# Patient Record
Sex: Male | Born: 1980 | Race: White | Hispanic: No | Marital: Single | Smoking: Never smoker
Health system: Southern US, Community
[De-identification: ages and names within clinical notes are randomized; demographics above are authoritative.]

## PROBLEM LIST (undated history)

## (undated) DIAGNOSIS — K668 Other specified disorders of peritoneum: Secondary | ICD-10-CM

## (undated) DIAGNOSIS — F819 Developmental disorder of scholastic skills, unspecified: Secondary | ICD-10-CM

## (undated) DIAGNOSIS — T189XXA Foreign body of alimentary tract, part unspecified, initial encounter: Secondary | ICD-10-CM

## (undated) DIAGNOSIS — K3189 Other diseases of stomach and duodenum: Secondary | ICD-10-CM

## (undated) HISTORY — DX: Developmental disorder of scholastic skills, unspecified: F81.9

## (undated) HISTORY — DX: Other specified disorders of peritoneum: K66.8

## (undated) HISTORY — DX: Other diseases of stomach and duodenum: K31.89

## (undated) HISTORY — DX: Foreign body of alimentary tract, part unspecified, initial encounter: T18.9XXA

---

## 2018-09-14 ENCOUNTER — Other Ambulatory Visit: Payer: Self-pay

## 2018-09-14 ENCOUNTER — Encounter: Payer: Self-pay | Admitting: Emergency Medicine

## 2018-09-14 ENCOUNTER — Emergency Department: Payer: Medicare Other | Admitting: Anesthesiology

## 2018-09-14 ENCOUNTER — Emergency Department: Payer: Medicare Other

## 2018-09-14 ENCOUNTER — Inpatient Hospital Stay
Admission: EM | Admit: 2018-09-14 | Discharge: 2018-09-18 | DRG: 326 | Disposition: A | Discharge status: Home / Self Care | Payer: Medicare Other | Attending: Surgery | Admitting: Surgery

## 2018-09-14 ENCOUNTER — Encounter
Admission: EM | Disposition: A | Discharge status: Home / Self Care | Payer: Self-pay | Source: Home / Self Care | Attending: Surgery

## 2018-09-14 DIAGNOSIS — K659 Peritonitis, unspecified: Secondary | ICD-10-CM | POA: Diagnosis present

## 2018-09-14 DIAGNOSIS — R198 Other specified symptoms and signs involving the digestive system and abdomen: Secondary | ICD-10-CM

## 2018-09-14 DIAGNOSIS — S3639XA Other injury of stomach, initial encounter: Principal | ICD-10-CM | POA: Diagnosis present

## 2018-09-14 DIAGNOSIS — T182XXA Foreign body in stomach, initial encounter: Secondary | ICD-10-CM | POA: Diagnosis present

## 2018-09-14 DIAGNOSIS — T189XXA Foreign body of alimentary tract, part unspecified, initial encounter: Secondary | ICD-10-CM

## 2018-09-14 DIAGNOSIS — K3189 Other diseases of stomach and duodenum: Secondary | ICD-10-CM | POA: Diagnosis not present

## 2018-09-14 DIAGNOSIS — K668 Other specified disorders of peritoneum: Secondary | ICD-10-CM | POA: Diagnosis not present

## 2018-09-14 DIAGNOSIS — F79 Unspecified intellectual disabilities: Secondary | ICD-10-CM | POA: Diagnosis present

## 2018-09-14 HISTORY — DX: Other diseases of stomach and duodenum: K31.89

## 2018-09-14 HISTORY — PX: LAPAROTOMY: SHX154

## 2018-09-14 LAB — COMPREHENSIVE METABOLIC PANEL
ALBUMIN: 4.9 g/dL (ref 3.5–5.0)
ALT: 58 U/L — ABNORMAL HIGH (ref 0–44)
ANION GAP: 10 (ref 5–15)
AST: 34 U/L (ref 15–41)
Alkaline Phosphatase: 81 U/L (ref 38–126)
BUN: 12 mg/dL (ref 6–20)
CO2: 28 mmol/L (ref 22–32)
Calcium: 9.8 mg/dL (ref 8.9–10.3)
Chloride: 100 mmol/L (ref 98–111)
Creatinine, Ser: 0.86 mg/dL (ref 0.61–1.24)
GFR calc Af Amer: >60 mL/min (ref >60)
GFR calc non Af Amer: >60 mL/min (ref >60)
GLUCOSE: 166 mg/dL — AB (ref 70–99)
POTASSIUM: 4 mmol/L (ref 3.5–5.1)
SODIUM: 138 mmol/L (ref 135–145)
Total Bilirubin: 1 mg/dL (ref 0.3–1.2)
Total Protein: 9 g/dL — ABNORMAL HIGH (ref 6.5–8.1)

## 2018-09-14 LAB — CBC
HCT: 44 % (ref 40.0–52.0)
Hemoglobin: 15.2 g/dL (ref 13.0–18.0)
MCH: 30.5 pg (ref 26.0–34.0)
MCHC: 34.7 g/dL (ref 32.0–36.0)
MCV: 87.9 fL (ref 80.0–100.0)
PLATELETS: 247 10*3/uL (ref 150–440)
RBC: 5 MIL/uL (ref 4.40–5.90)
RDW: 13.8 % (ref 11.5–14.5)
WBC: 18.3 10*3/uL — AB (ref 3.8–10.6)

## 2018-09-14 LAB — URINALYSIS, COMPLETE (UACMP) WITH MICROSCOPIC
Bacteria, UA: NONE SEEN
Bilirubin Urine: NEGATIVE
GLUCOSE, UA: NEGATIVE mg/dL
KETONES UR: 5 mg/dL — AB
Leukocytes, UA: NEGATIVE
Nitrite: NEGATIVE
PH: 5 (ref 5.0–8.0)
PROTEIN: NEGATIVE mg/dL
Specific Gravity, Urine: 1.025 (ref 1.005–1.030)
WBC UA: NONE SEEN WBC/hpf (ref 0–5)

## 2018-09-14 LAB — PROTIME-INR
INR: 0.9
PROTHROMBIN TIME: 12.1 s (ref 11.4–15.2)

## 2018-09-14 LAB — TYPE AND SCREEN
ABO/RH(D): O POS
ANTIBODY SCREEN: NEGATIVE

## 2018-09-14 LAB — LIPASE, BLOOD: Lipase: 23 U/L (ref 11–51)

## 2018-09-14 SURGERY — LAPAROTOMY, EXPLORATORY
Anesthesia: General | Site: Abdomen

## 2018-09-14 MED ORDER — KETOROLAC TROMETHAMINE 30 MG/ML IJ SOLN
30.0000 mg | Freq: Four times a day (QID) | INTRAMUSCULAR | Status: DC
  Administered 2018-09-14 – 2018-09-18 (×15): 30 mg via INTRAVENOUS
  Filled 2018-09-14 (×15): qty 1

## 2018-09-14 MED ORDER — ONDANSETRON HCL 4 MG/2ML IJ SOLN
INTRAMUSCULAR | Status: AC
  Filled 2018-09-14: qty 2

## 2018-09-14 MED ORDER — FENTANYL CITRATE (PF) 100 MCG/2ML IJ SOLN: 25.0000 ug | INTRAMUSCULAR | Status: DC | PRN

## 2018-09-14 MED ORDER — SODIUM CHLORIDE 0.9 % IV BOLUS
1000.0000 mL | Freq: Once | INTRAVENOUS | Status: AC
  Administered 2018-09-14: 1000 mL via INTRAVENOUS

## 2018-09-14 MED ORDER — KETOROLAC TROMETHAMINE 30 MG/ML IJ SOLN
INTRAMUSCULAR | Status: DC | PRN
  Administered 2018-09-14: 30 mg via INTRAVENOUS

## 2018-09-14 MED ORDER — FENTANYL CITRATE (PF) 100 MCG/2ML IJ SOLN
INTRAMUSCULAR | Status: DC | PRN
  Administered 2018-09-14 (×2): 50 ug via INTRAVENOUS

## 2018-09-14 MED ORDER — ENOXAPARIN SODIUM 40 MG/0.4ML ~~LOC~~ SOLN
40.0000 mg | SUBCUTANEOUS | Status: DC
  Administered 2018-09-15 – 2018-09-18 (×4): 40 mg via SUBCUTANEOUS
  Filled 2018-09-14 (×4): qty 0.4

## 2018-09-14 MED ORDER — HYDROMORPHONE HCL 1 MG/ML IJ SOLN
1.0000 mg | Freq: Once | INTRAMUSCULAR | Status: AC
  Administered 2018-09-14: 1 mg via INTRAVENOUS
  Filled 2018-09-14: qty 1

## 2018-09-14 MED ORDER — LIDOCAINE HCL (PF) 2 % IJ SOLN
INTRAMUSCULAR | Status: AC
  Filled 2018-09-14: qty 10

## 2018-09-14 MED ORDER — LIDOCAINE HCL (CARDIAC) PF 100 MG/5ML IV SOSY
PREFILLED_SYRINGE | INTRAVENOUS | Status: DC | PRN
  Administered 2018-09-14: 100 mg via INTRAVENOUS

## 2018-09-14 MED ORDER — MORPHINE SULFATE (PF) 2 MG/ML IV SOLN
2.0000 mg | INTRAVENOUS | Status: DC | PRN
  Administered 2018-09-14: 2 mg via INTRAVENOUS
  Filled 2018-09-14: qty 1

## 2018-09-14 MED ORDER — LACTATED RINGERS IV SOLN
INTRAVENOUS | Status: DC | PRN
  Administered 2018-09-14 (×2): via INTRAVENOUS

## 2018-09-14 MED ORDER — OXYCODONE HCL 5 MG/5ML PO SOLN: 5.0000 mg | Freq: Once | ORAL | Status: DC | PRN

## 2018-09-14 MED ORDER — BUPIVACAINE LIPOSOME 1.3 % IJ SUSP
INTRAMUSCULAR | Status: AC
  Filled 2018-09-14: qty 20

## 2018-09-14 MED ORDER — MIDAZOLAM HCL 2 MG/2ML IJ SOLN
INTRAMUSCULAR | Status: AC
  Filled 2018-09-14: qty 2

## 2018-09-14 MED ORDER — SODIUM CHLORIDE 0.9 % IV SOLN
2.0000 g | INTRAVENOUS | Status: DC
  Administered 2018-09-14 – 2018-09-17 (×4): 2 g via INTRAVENOUS
  Filled 2018-09-14 (×2): qty 2
  Filled 2018-09-14: qty 20
  Filled 2018-09-14 (×2): qty 2

## 2018-09-14 MED ORDER — MIDAZOLAM HCL 2 MG/2ML IJ SOLN
INTRAMUSCULAR | Status: DC | PRN
  Administered 2018-09-14: 2 mg via INTRAVENOUS

## 2018-09-14 MED ORDER — BUPIVACAINE HCL (PF) 0.5 % IJ SOLN
INTRAMUSCULAR | Status: DC | PRN
  Administered 2018-09-14: 30 mL

## 2018-09-14 MED ORDER — SUGAMMADEX SODIUM 200 MG/2ML IV SOLN
INTRAVENOUS | Status: DC | PRN
  Administered 2018-09-14: 200 mg via INTRAVENOUS

## 2018-09-14 MED ORDER — PIPERACILLIN-TAZOBACTAM 3.375 G IVPB 30 MIN
3.3750 g | Freq: Once | INTRAVENOUS | Status: AC
  Administered 2018-09-14: 3.375 g via INTRAVENOUS
  Filled 2018-09-14: qty 50

## 2018-09-14 MED ORDER — ACETAMINOPHEN 10 MG/ML IV SOLN
INTRAVENOUS | Status: DC | PRN
  Administered 2018-09-14: 1000 mg via INTRAVENOUS

## 2018-09-14 MED ORDER — ONDANSETRON HCL 4 MG/2ML IJ SOLN
4.0000 mg | Freq: Four times a day (QID) | INTRAMUSCULAR | Status: DC | PRN
  Administered 2018-09-14: 4 mg via INTRAVENOUS
  Filled 2018-09-14: qty 2

## 2018-09-14 MED ORDER — IOPAMIDOL (ISOVUE-300) INJECTION 61%
100.0000 mL | Freq: Once | INTRAVENOUS | Status: AC | PRN
  Administered 2018-09-14: 100 mL via INTRAVENOUS
  Filled 2018-09-14: qty 100

## 2018-09-14 MED ORDER — SEVOFLURANE IN SOLN
RESPIRATORY_TRACT | Status: AC
  Filled 2018-09-14: qty 250

## 2018-09-14 MED ORDER — BUPIVACAINE HCL (PF) 0.5 % IJ SOLN
INTRAMUSCULAR | Status: AC
  Filled 2018-09-14: qty 30

## 2018-09-14 MED ORDER — ONDANSETRON 4 MG PO TBDP: 4.0000 mg | ORAL_TABLET | Freq: Four times a day (QID) | ORAL | Status: DC | PRN

## 2018-09-14 MED ORDER — ROCURONIUM BROMIDE 100 MG/10ML IV SOLN
INTRAVENOUS | Status: DC | PRN
  Administered 2018-09-14: 50 mg via INTRAVENOUS

## 2018-09-14 MED ORDER — KETOROLAC TROMETHAMINE 30 MG/ML IJ SOLN
INTRAMUSCULAR | Status: AC
  Filled 2018-09-14: qty 1

## 2018-09-14 MED ORDER — BUPIVACAINE LIPOSOME 1.3 % IJ SUSP
INTRAMUSCULAR | Status: DC | PRN
  Administered 2018-09-14: 20 mL

## 2018-09-14 MED ORDER — ONDANSETRON HCL 4 MG/2ML IJ SOLN
INTRAMUSCULAR | Status: DC | PRN
  Administered 2018-09-14: 4 mg via INTRAVENOUS

## 2018-09-14 MED ORDER — DEXAMETHASONE SODIUM PHOSPHATE 10 MG/ML IJ SOLN
INTRAMUSCULAR | Status: DC | PRN
  Administered 2018-09-14: 10 mg via INTRAVENOUS

## 2018-09-14 MED ORDER — SUGAMMADEX SODIUM 200 MG/2ML IV SOLN
INTRAVENOUS | Status: AC
  Filled 2018-09-14: qty 2

## 2018-09-14 MED ORDER — OXYCODONE HCL 5 MG PO TABS: 5.0000 mg | ORAL_TABLET | Freq: Once | ORAL | Status: DC | PRN

## 2018-09-14 MED ORDER — PROPOFOL 10 MG/ML IV BOLUS
INTRAVENOUS | Status: DC | PRN
  Administered 2018-09-14: 200 mg via INTRAVENOUS

## 2018-09-14 MED ORDER — ACETAMINOPHEN 10 MG/ML IV SOLN
INTRAVENOUS | Status: AC
  Filled 2018-09-14: qty 100

## 2018-09-14 MED ORDER — PROPOFOL 10 MG/ML IV BOLUS
INTRAVENOUS | Status: AC
  Filled 2018-09-14: qty 20

## 2018-09-14 MED ORDER — DEXTROSE IN LACTATED RINGERS 5 % IV SOLN
INTRAVENOUS | Status: DC
  Administered 2018-09-14 – 2018-09-16 (×4): via INTRAVENOUS

## 2018-09-14 MED ORDER — ONDANSETRON HCL 4 MG/2ML IJ SOLN
4.0000 mg | Freq: Once | INTRAMUSCULAR | Status: AC
  Administered 2018-09-14: 4 mg via INTRAVENOUS
  Filled 2018-09-14: qty 2

## 2018-09-14 MED ORDER — FENTANYL CITRATE (PF) 100 MCG/2ML IJ SOLN
INTRAMUSCULAR | Status: AC
  Filled 2018-09-14: qty 2

## 2018-09-14 MED ORDER — DEXAMETHASONE SODIUM PHOSPHATE 10 MG/ML IJ SOLN
INTRAMUSCULAR | Status: AC
  Filled 2018-09-14: qty 1

## 2018-09-14 MED ORDER — PHENYLEPHRINE HCL 10 MG/ML IJ SOLN
INTRAMUSCULAR | Status: DC | PRN
  Administered 2018-09-14 (×2): 100 ug via INTRAVENOUS

## 2018-09-14 MED ORDER — FAMOTIDINE IN NACL 20-0.9 MG/50ML-% IV SOLN
20.0000 mg | Freq: Two times a day (BID) | INTRAVENOUS | Status: DC
  Administered 2018-09-14 – 2018-09-18 (×8): 20 mg via INTRAVENOUS
  Filled 2018-09-14 (×8): qty 50

## 2018-09-14 MED ORDER — SUCCINYLCHOLINE CHLORIDE 20 MG/ML IJ SOLN
INTRAMUSCULAR | Status: DC | PRN
  Administered 2018-09-14: 100 mg via INTRAVENOUS

## 2018-09-14 SURGICAL SUPPLY — 46 items
APPLIER CLIP 11 MED OPEN (CLIP)
APPLIER CLIP 13 LRG OPEN (CLIP)
BAG BILE T-TUBES STRL (MISCELLANEOUS) IMPLANT
BARRIER SKIN 2 3/4 (OSTOMY) IMPLANT
BARRIER SKIN 2 3/4 INCH (OSTOMY)
CHLORAPREP W/TINT 26ML (MISCELLANEOUS) ×3 IMPLANT
CLAMP POUCH DRAINAGE QUIET (OSTOMY) IMPLANT
CLIP APPLIE 11 MED OPEN (CLIP) IMPLANT
CLIP APPLIE 13 LRG OPEN (CLIP) IMPLANT
DRAPE LAPAROTOMY 100X77 ABD (DRAPES) ×3 IMPLANT
DRSG OPSITE POSTOP 4X10 (GAUZE/BANDAGES/DRESSINGS) ×3 IMPLANT
ELECT BLADE 6 FLAT ULTRCLN (ELECTRODE) ×3 IMPLANT
ELECT REM PT RETURN 9FT ADLT (ELECTROSURGICAL) ×3
ELECTRODE REM PT RTRN 9FT ADLT (ELECTROSURGICAL) ×1 IMPLANT
GAUZE SPONGE 4X4 12PLY STRL (GAUZE/BANDAGES/DRESSINGS) ×3 IMPLANT
GLOVE BIOGEL PI IND STRL 7.5 (GLOVE) ×1 IMPLANT
GLOVE BIOGEL PI INDICATOR 7.5 (GLOVE) ×2
GLOVE ECLIPSE 7.0 STRL STRAW (GLOVE) ×3 IMPLANT
GOWN STRL REUS W/TWL LRG LVL3 (GOWN DISPOSABLE) ×9 IMPLANT
HANDLE SUCTION POOLE (INSTRUMENTS) ×1 IMPLANT
KIT TURNOVER KIT A (KITS) ×3 IMPLANT
NEEDLE HYPO 22GX1.5 SAFETY (NEEDLE) ×3 IMPLANT
NS IRRIG 1000ML POUR BTL (IV SOLUTION) ×3 IMPLANT
PACK BASIN MAJOR ARMC (MISCELLANEOUS) ×3 IMPLANT
PACK COLON CLEAN CLOSURE (MISCELLANEOUS) ×3 IMPLANT
RELOAD LINEAR CUT PROX 55 BLUE (ENDOMECHANICALS) IMPLANT
RELOAD PROXIMATE 75MM BLUE (ENDOMECHANICALS) IMPLANT
RETRACTOR WND ALEXIS-O 25 LRG (MISCELLANEOUS) IMPLANT
RETRACTOR WOUND ALXS 18CM MED (MISCELLANEOUS) IMPLANT
RTRCTR WOUND ALEXIS O 18CM MED (MISCELLANEOUS)
RTRCTR WOUND ALEXIS O 25CM LRG (MISCELLANEOUS)
STAPLER GUN LINEAR PROX 60 (STAPLE) IMPLANT
STAPLER PROXIMATE 55 BLUE (STAPLE) IMPLANT
STAPLER PROXIMATE 75MM BLUE (STAPLE) IMPLANT
SUCTION POOLE HANDLE (INSTRUMENTS) ×3
SUT CHROMIC 0 SH (SUTURE) IMPLANT
SUT CHROMIC 2 0 SH (SUTURE) IMPLANT
SUT PDS #1 CTX NDL (SUTURE) ×3 IMPLANT
SUT PDS AB 0 CT1 27 (SUTURE) ×6 IMPLANT
SUT SILK 2 0 (SUTURE) ×2
SUT SILK 2-0 18XBRD TIE 12 (SUTURE) ×1 IMPLANT
SUT SILK 3-0 (SUTURE) ×2
SUT SILK 3-0 SH-1 18XCR BRD (SUTURE) ×1
SUTURE SILK 3-0 SH-1 18XCR BRD (SUTURE) ×1 IMPLANT
SYR 20CC LL (SYRINGE) ×3 IMPLANT
TRAY FOLEY SLVR 16FR LF STAT (SET/KITS/TRAYS/PACK) ×3 IMPLANT

## 2018-09-14 NOTE — ED Notes (Signed)
Pt states last meal was today at 10:00am. Pt enies N/V/D. NAD noted. Family at bedside

## 2018-09-14 NOTE — Anesthesia Procedure Notes (Addendum)
Procedure Name: Intubation Date/Time: 09/14/2018 4:33 PM Performed by: Doreen Salvage, CRNA Pre-anesthesia Checklist: Patient identified, Emergency Drugs available, Suction available and Patient being monitored Patient Re-evaluated:Patient Re-evaluated prior to induction Oxygen Delivery Method: Circle system utilized Preoxygenation: Pre-oxygenation with 100% oxygen Induction Type: IV induction, Cricoid Pressure applied and Rapid sequence Ventilation: Mask ventilation without difficulty Laryngoscope Size: Mac and 4 Grade View: Grade II Tube type: Oral Tube size: 7.5 mm Number of attempts: 1 Airway Equipment and Method: Stylet Placement Confirmation: ETT inserted through vocal cords under direct vision,  positive ETCO2 and breath sounds checked- equal and bilateral Secured at: 23 cm Tube secured with: Tape Dental Injury: Teeth and Oropharynx as per pre-operative assessment

## 2018-09-14 NOTE — ED Provider Notes (Addendum)
[facility] Emergency Department Provider Note  ____________________________________________   I have reviewed the triage vital signs and the nursing notes. Where available I have reviewed prior notes and, if possible and indicated, outside hospital notes.    HISTORY  Chief Complaint Abdominal Pain    HPI Ronald Hardy is a 37 y.o. male who does not have a history of abdominal surgery states he rolled over last night or this morning early and had a sharp pain to his abdomen which is been getting progressively worse.  No fever no vomiting no melena no bright red blood per rectum normal bowel meds, its worse when he changes position, has not tried to eat, no other alleviating or aggravating factors, no other complaint.  Denies fever or chills.  It is sharp and significant.  No radiation.    History reviewed. No pertinent past medical history.  There are no active problems to display for this patient.   History reviewed. No pertinent surgical history.  Prior to Admission medications   Not on File    Allergies Patient has no known allergies.  No family history on file.  Social History Social History   Tobacco Use  . Smoking status: Never Smoker  . Smokeless tobacco: Never Used  Substance Use Topics  . Alcohol use: Not Currently  . Drug use: Not on file    Review of Systems Constitutional: No fever/chills Eyes: No visual changes. ENT: No sore throat. No stiff neck no neck pain Cardiovascular: Denies chest pain. Respiratory: Denies shortness of breath. Gastrointestinal:   no vomiting.  No diarrhea.  No constipation. Genitourinary: Negative for dysuria. Musculoskeletal: Negative lower extremity swelling Skin: Negative for rash. Neurological: Negative for severe headaches, focal weakness or numbness.   ____________________________________________   PHYSICAL EXAM:  VITAL SIGNS: ED Triage Vitals [09/14/18 1232]  Enc Vitals Group     BP  132/82     Pulse Rate (!) 103     Resp 18     Temp 98.3 F (36.8 C)     Temp Source Oral     SpO2 97 %     Weight 175 lb (79.4 kg)     Height 5\' 5"  (1.651 m)     Head Circumference      Peak Flow      Pain Score 10     Pain Loc      Pain Edu?      Excl. in GC?     Constitutional: Alert and oriented.  Peers to be uncomfortable but nontoxic Eyes: Conjunctivae are normal Head: Atraumatic HEENT: No congestion/rhinnorhea. Mucous membranes are moist.  Oropharynx non-erythematous Neck:   Nontender with no meningismus, no masses, no stridor Cardiovascular: Normal rate, regular rhythm. Grossly normal heart sounds.  Good peripheral circulation. Respiratory: Normal respiratory effort.  No retractions. Lungs CTAB. Abdominal: Abdomen is rigid with voluntary and involuntary guarding.  Difficult to appreciate rebound.  Diminished bowel sounds.  Positive peritoneal signs. Back:  There is no focal tenderness or step off.  there is no midline tenderness there are no lesions noted. there is no CVA tenderness Musculoskeletal: No lower extremity tenderness, no upper extremity tenderness. No joint effusions, no DVT signs strong distal pulses no edema Neurologic:  Normal speech and language. No gross focal neurologic deficits are appreciated.  Skin:  Skin is warm, dry and intact. No rash noted. Psychiatric: Mood and affect are normal. Speech and behavior are normal.  ____________________________________________   LABS (all labs ordered are listed,  but only abnormal results are displayed)  Labs Reviewed  URINALYSIS, COMPLETE (UACMP) WITH MICROSCOPIC - Abnormal; Notable for the following components:      Result Value   Color, Urine YELLOW (*)    APPearance HAZY (*)    Hgb urine dipstick SMALL (*)    Ketones, ur 5 (*)    All other components within normal limits  COMPREHENSIVE METABOLIC PANEL - Abnormal; Notable for the following components:   Glucose, Bld 166 (*)    Total Protein 9.0 (*)    ALT  58 (*)    All other components within normal limits  CBC - Abnormal; Notable for the following components:   WBC 18.3 (*)    All other components within normal limits  LIPASE, BLOOD    Pertinent labs  results that were available during my care of the patient were reviewed by me and considered in my medical decision making (see chart for details). ____________________________________________  EKG  I personally interpreted any EKGs ordered by me or triage  ____________________________________________  RADIOLOGY  Pertinent labs & imaging results that were available during my care of the patient were reviewed by me and considered in my medical decision making (see chart for details). If possible, patient and/or family made aware of any abnormal findings.  No results found. ____________________________________________    PROCEDURES  Procedure(s) performed: None  Procedures  Critical Care performed: CRITICAL CARE Performed by: Jeanmarie Plant   Total critical care time: 45 minutes  Critical care time was exclusive of separately billable procedures and treating other patients.  Critical care was necessary to treat or prevent imminent or life-threatening deterioration.  Critical care was time spent personally by me on the following activities: development of treatment plan with patient and/or surrogate as well as nursing, discussions with consultants, evaluation of patient's response to treatment, examination of patient, obtaining history from patient or surrogate, ordering and performing treatments and interventions, ordering and review of laboratory studies, ordering and review of radiographic studies, pulse oximetry and re-evaluation of patient's condition.   ____________________________________________   INITIAL IMPRESSION / ASSESSMENT AND PLAN / ED COURSE  Pertinent labs & imaging results that were available during my care of the patient were reviewed by me and  considered in my medical decision making (see chart for details).   She with peritoneal signs, I immediately started him on antibiotics, pain medication IV fluid, we are keeping him n.p.o., unclear initially what the etiology was.  I sent him therefore for CT and accompanied him to CT, I did make surgery aware after I did my initial exam about my concerns for this man's belly is that I think is a surgical belly.  CT scan shows a foreign body in the stomach with evidence of free air.  Patient has no recollection of ever swallowing anything like that.  It is about the consistency CT scan of bone or possibly dense plastic according to radiologist with whom I have also personally discussed the case.  I also looked at the film myself.  We are instituting preop work-up on the patient, again, see below, surgery attending is in the operating room we are making him aware of our findings and as noted patient is already had antibiotics  ----------------------------------------- 2:52 PM on 09/14/2018 -----------------------------------------  Called dr. Earlene Plater of surgery, he is operating. Will keep him posted.    ----------------------------------------- 3:20 PM on 09/14/2018 ----------------------------------------- Paging surgery again  ----------------------------------------- 3:27 PM on 09/14/2018 -----------------------------------------  Inform Dr. Earlene Plater of surgery  about patient, initiating preop work-up, antibiotics already administered, patient much more comfortable after pain medication, informed Dr. Earlene Plater and patient no findings on CT scan.  Administering IV fluid, awaiting surgical disposition.  Patient has no recollection of swallowing a foreign body.     ____________________________________________   FINAL CLINICAL IMPRESSION(S) / ED DIAGNOSES  Final diagnoses:  None      This chart was dictated using voice recognition software.  Despite best efforts to proofread,  errors can  occur which can change meaning.      Jeanmarie Plant, MD 09/14/18 1523    Jeanmarie Plant, MD 09/14/18 1528    Jeanmarie Plant, MD 09/14/18 [phone]

## 2018-09-14 NOTE — H&P (Addendum)
SURGICAL ADMISSION H&P  Patient seen and examined as described below with surgical PA-C, Gillermina Phy.  Assessment/Plan: (ICD-10's: K66.8,T18.9) In summary, patient is a 37 y.o. male with gastro-pyloric perforation secondary to ingested shark tooth 3 days ago, complicated by pertinent comorbidities including developmental delay.             - NPO, IV fluids             - IV antibiotics (Zosyn already administered)            - all risks, benefits, and alternatives to exploratory laparotomy with extraction of foreign body and repair of visceral perforation were discussed with the patient and his mother, all of their questions were answered to their expressed satisfaction, patient and his mom express they wish to proceed, and informed consent was obtained.            - will proceed immediately to OR for above procedure  I have personally reviewed the patient's chart, evaluated/examined the patient, proposed the recommended management, and discussed these recommendations with the patient and his family to their expressed satisfaction as well as with patient's ED physician.  Thank you for the opportunity to participate in this patient's care.  -- Scherrie Gerlach Earlene Plater, MD, RPVI [place]: [facility] General Surgery - Partnering for exceptional care. Office: [phone]    SURGICAL H&P NOTE  HISTORY OF PRESENT ILLNESS (HPI):  37 y.o. male presented to [facility] ED today for evaluation of abdominal pain. Patient reports the acute onset of worsening epigastric abdominal pain this morning. He went to [facility] this morning and diagnosed with gastritis. However, as the day went on he continued to notice worsening pain. The pain is sharp in nature and 10/10. After investigation, the patient endorsed that he put a shark tooth necklace in his mouth and swallowed it about a week ago. He did not notice any pain until this morning. He has been eating without difficulty  until today. Has had non-bloody bowel movements.  Surgery is consulted by emergency physician physician Dr. Alphonzo Lemmings, MD in this context for evaluation and management of abdominal pain, pneumoperitoneum. Marland Kitchen  PAST MEDICAL HISTORY (PMH):  History reviewed. No pertinent past medical history.   PAST SURGICAL HISTORY (PSH):  History reviewed. No pertinent surgical history.   MEDICATIONS:  Prior to Admission medications   Not on File     ALLERGIES:  No Known Allergies   SOCIAL HISTORY:  Social History   Socioeconomic History  . Marital status: Single    Spouse name: Not on file  . Number of children: Not on file  . Years of education: Not on file  . Highest education level: Not on file  Occupational History  . Not on file  Social Needs  . Financial resource strain: Not on file  . Food insecurity:    Worry: Not on file    Inability: Not on file  . Transportation needs:    Medical: Not on file    Non-medical: Not on file  Tobacco Use  . Smoking status: Never Smoker  . Smokeless tobacco: Never Used  Substance and Sexual Activity  . Alcohol use: Not Currently  . Drug use: Not on file  . Sexual activity: Not on file  Lifestyle  . Physical activity:    Days per week: Not on file    Minutes per session: Not on file  . Stress: Not on file  Relationships  . Social connections:    Talks on phone:  Not on file    Gets together: Not on file    Attends religious service: Not on file    Active member of club or organization: Not on file    Attends meetings of clubs or organizations: Not on file    Relationship status: Not on file  . Intimate partner violence:    Fear of current or ex partner: Not on file    Emotionally abused: Not on file    Physically abused: Not on file    Forced sexual activity: Not on file  Other Topics Concern  . Not on file  Social History Narrative  . Not on file    The patient currently resides (home / rehab facility / nursing home): Home The  patient normally is (ambulatory / bedbound): Ambulatory   FAMILY HISTORY:  No family history on file.   REVIEW OF SYSTEMS:  Constitutional: denies weight loss, fever, chills, or sweats  Eyes: denies any other vision changes, history of eye injury  ENT: denies sore throat, hearing problems  Respiratory: denies shortness of breath, wheezing  Cardiovascular: denies chest pain, palpitations  Gastrointestinal: + abdominal pain, denied N/V, or diarrhea/and bowel function as per HPI Genitourinary: denies burning with urination or urinary frequency Musculoskeletal: denies any other joint pains or cramps  Skin: denies any other rashes or skin discolorations  Neurological: denies any other headache, dizziness, weakness  Psychiatric: denies any other depression, anxiety   All other review of systems were negative   VITAL SIGNS:  Temp:  [98.3 F (36.8 C)-100 F (37.8 C)] 100 F (37.8 C) (10/02 1445) Pulse Rate:  [101-112] 112 (10/02 1614) Resp:  [18-22] 22 (10/02 1614) BP: (116-137)/(79-93) 137/93 (10/02 1614) SpO2:  [94 %-98 %] 94 % (10/02 1614) Weight:  [79.4 kg] 79.4 kg (10/02 1257)     Height: 5\' 4"  (162.6 cm) Weight: 79.4 kg BMI (Calculated): 30.02   INTAKE/OUTPUT:  This shift: No intake/output data recorded.  Last 2 shifts: @IOLAST2SHIFTS @   PHYSICAL EXAM:  Constitutional:  -- Normal body habitus  -- Awake, alert, and oriented x3, no apparent distress Eyes:  -- Pupils equally round and reactive to light  -- No scleral icterus, B/L no occular discharge Ear, nose, throat: -- Neck is FROM WNL Pulmonary:  -- No wheezes or rhales -- Equal breath sounds bilaterally -- Breathing non-labored at rest Cardiovascular:  -- S1, S2 present -- Tachycardic -- No pericardial rubs  Gastrointestinal:  -- Abdomen is tense, tender epigastric, non-distended, + guarding, unable to appreciate rebound, grossly peritonitic  -- No abdominal masses appreciated, pulsatile or otherwise   Musculoskeletal and Integumentary:  -- Wounds or skin discoloration: None appreciated -- Extremities: B/L UE and LE FROM, hands and feet warm, no edema  Neurologic:  -- Motor function: Intact and symmetric -- Sensation: Intact and symmetric Psychiatric:  -- Mood and affect WNL    Labs:  CBC Latest Ref Rng & Units 09/14/2018  WBC 3.8 - 10.6 K/uL 18.3(H)  Hemoglobin 13.0 - 18.0 g/dL 16.1  Hematocrit 09.6 - 52.0 % 44.0  Platelets 150 - 440 K/uL 247   CMP Latest Ref Rng & Units 09/14/2018  Glucose 70 - 99 mg/dL 045(W)  BUN 6 - 20 mg/dL 12  Creatinine 0.98 - 1.19 mg/dL 1.47  Sodium 829 - 562 mmol/L 138  Potassium 3.5 - 5.1 mmol/L 4.0  Chloride 98 - 111 mmol/L 100  CO2 22 - 32 mmol/L 28  Calcium 8.9 - 10.3 mg/dL 9.8  Total Protein 6.5 -  8.1 g/dL 9.0(H)  Total Bilirubin 0.3 - 1.2 mg/dL 1.0  Alkaline Phos 38 - 126 U/L 81  AST 15 - 41 U/L 34  ALT 0 - 44 U/L 58(H)    Imaging studies:   CT Abdomen/Pelvis on 10/02:   IMPRESSION: 1. Small volume pneumoperitoneum due to gastric antral perforation by 4.1 x 1.3 cm radiopaque foreign body (likely glass). Small volume ascites. 2. Borderline cardiomegaly, pulmonary vascular congestion or atelectasis. Recommend chest radiograph. 3. Patulous esophagus, potential achalasia. 4. Acute findings discussed with and reconfirmed by Dr.JAMES MCSHANE on 09/14/2018 at 3:20 pm.  Assessment/Plan: (ICD-10's: K66.8, T18.9) 37 y.o. male with swallowed foreign body and pneumoperitoneum, complicated by pertinent comorbidities including developmental delay.   - Patient with retained foreign body on CT with pneumoperitoneum and peritonitis on examination. Discussed need for emergent laparotomy with the patient and his mother who are in agreement.   - Will admit to general surgery for post-operative management   A/all risks, benefits, and alternatives to above emergent procedure(s) were discussed with the patient and his family, all of their questions  were answered to their expressed satisfaction, patient expresses he wishes to proceed, and informed consent was obtained.  All of the above findings and recommendations were discussed with the patient and his family, and all of patient's and his family's questions were answered to their expressed satisfaction.  Thank you for the opportunity to participate in this patient's care.   -- Lynden Oxford, PA-C [facility] 09/14/2018, 4:24 PM [phone] M-F: 7am - 4pm

## 2018-09-14 NOTE — ED Notes (Signed)
Report called to tanaleah rn or nurse.

## 2018-09-14 NOTE — Anesthesia Post-op Follow-up Note (Signed)
Anesthesia QCDR form completed.        

## 2018-09-14 NOTE — ED Notes (Signed)
Pt to Ct at this time.

## 2018-09-14 NOTE — Consult Note (Addendum)
SURGICAL CONSULTATION NOTE (initial) - cpt: 16109  Patient seen and examined as described below with surgical PA-C, Gillermina Phy.  Assessment/Plan: (ICD-10's: K66.8, T18.9) In summary, patient is a 37 y.o. male with gastro-pyloric perforation secondary to ingested shark tooth 3 days ago, complicated by pertinent comorbidities including developmental delay.   - NPO, IV fluids   - IV antibiotics (Zosyn already administered)  - all risks, benefits, and alternatives to exploratory laparotomy with extraction of foreign body and repair of visceral perforation were discussed with the patient and his mother, all of their questions were answered to their expressed satisfaction, patient and his mom express they wish to proceed, and informed consent was obtained.  - will proceed immediately to OR for above procedure  I have personally reviewed the patient's chart, evaluated/examined the patient, proposed the recommended management, and discussed these recommendations with the patient and his family to their expressed satisfaction as well as with patient's ED physician.  Thank you for the opportunity to participate in this patient's care.  -- Scherrie Gerlach Earlene Plater, MD, RPVI [place]: [facility] General Surgery - Partnering for exceptional care. Office: [phone]  SURGICAL CONSULTATION NOTE (initial) - cpt: 99254   HISTORY OF PRESENT ILLNESS (HPI):  37 y.o. male presented to [facility] ED today for evaluation of abdominal pain. Patient reports the acute onset of worsening epigastric abdominal pain this morning. He went to [facility] this morning and diagnosed with gastritis. However, as the day went on he continued to notice worsening pain. The pain is sharp in nature and 10/10. After investigation, the patient endorsed that he put a shark tooth necklace in his mouth and swallowed it about a week ago. He did not notice any pain until this morning. He has been eating without difficulty until today.  Has had non-bloody bowel movements.  Surgery is consulted by emergency physician physician Dr. Alphonzo Lemmings, MD in this context for evaluation and management of abdominal pain, pneumoperitoneum. Marland Kitchen  PAST MEDICAL HISTORY (PMH):  History reviewed. No pertinent past medical history.   PAST SURGICAL HISTORY (PSH):  History reviewed. No pertinent surgical history.   MEDICATIONS:  Prior to Admission medications   Not on File     ALLERGIES:  No Known Allergies   SOCIAL HISTORY:  Social History        Socioeconomic History  . Marital status: Single    Spouse name: Not on file  . Number of children: Not on file  . Years of education: Not on file  . Highest education level: Not on file  Occupational History  . Not on file  Social Needs  . Financial resource strain: Not on file  . Food insecurity:    Worry: Not on file    Inability: Not on file  . Transportation needs:    Medical: Not on file    Non-medical: Not on file  Tobacco Use  . Smoking status: Never Smoker  . Smokeless tobacco: Never Used  Substance and Sexual Activity  . Alcohol use: Not Currently  . Drug use: Not on file  . Sexual activity: Not on file  Lifestyle  . Physical activity:    Days per week: Not on file    Minutes per session: Not on file  . Stress: Not on file  Relationships  . Social connections:    Talks on phone: Not on file    Gets together: Not on file    Attends religious service: Not on file    Active member of club  or organization: Not on file    Attends meetings of clubs or organizations: Not on file    Relationship status: Not on file  . Intimate partner violence:    Fear of current or ex partner: Not on file    Emotionally abused: Not on file    Physically abused: Not on file    Forced sexual activity: Not on file  Other Topics Concern  . Not on file  Social History Narrative  . Not on file    The patient currently resides (home / rehab  facility / nursing home): Home The patient normally is (ambulatory / bedbound): Ambulatory   FAMILY HISTORY:  No family history on file.   REVIEW OF SYSTEMS:  Constitutional: denies weight loss, fever, chills, or sweats  Eyes: denies any other vision changes, history of eye injury  ENT: denies sore throat, hearing problems  Respiratory: denies shortness of breath, wheezing  Cardiovascular: denies chest pain, palpitations  Gastrointestinal: + abdominal pain, denied N/V, or diarrhea/and bowel function as per HPI Genitourinary: denies burning with urination or urinary frequency Musculoskeletal: denies any other joint pains or cramps  Skin: denies any other rashes or skin discolorations  Neurological: denies any other headache, dizziness, weakness  Psychiatric: denies any other depression, anxiety   All other review of systems were negative   VITAL SIGNS:  Temp:  [98.3 F (36.8 C)-100 F (37.8 C)] 100 F (37.8 C) (10/02 1445) Pulse Rate:  [101-112] 112 (10/02 1614) Resp:  [18-22] 22 (10/02 1614) BP: (116-137)/(79-93) 137/93 (10/02 1614) SpO2:  [94 %-98 %] 94 % (10/02 1614) Weight:  [79.4 kg] 79.4 kg (10/02 1257)     Height: 5\' 4"  (162.6 cm) Weight: 79.4 kg BMI (Calculated): 30.02   INTAKE/OUTPUT:  This shift: No intake/output data recorded.  Last 2 shifts: @IOLAST2SHIFTS @   PHYSICAL EXAM:  Constitutional:  -- Normal body habitus  -- Awake, alert, and oriented x3, no apparent distress Eyes:  -- Pupils equally round and reactive to light  -- No scleral icterus, B/L no occular discharge Ear, nose, throat: -- Neck is FROM WNL Pulmonary:  -- No wheezes or rhales -- Equal breath sounds bilaterally -- Breathing non-labored at rest Cardiovascular:  -- S1, S2 present -- Tachycardic -- No pericardial rubs  Gastrointestinal:  -- Abdomen is tense, tender epigastric, non-distended, + guarding, unable to appreciate rebound, grossly peritonitic  -- No abdominal masses  appreciated, pulsatile or otherwise  Musculoskeletal and Integumentary:  -- Wounds or skin discoloration: None appreciated -- Extremities: B/L UE and LE FROM, hands and feet warm, no edema  Neurologic:  -- Motor function: Intact and symmetric -- Sensation: Intact and symmetric Psychiatric:  -- Mood and affect WNL    Labs:  CBC Latest Ref Rng & Units 09/14/2018  WBC 3.8 - 10.6 K/uL 18.3(H)  Hemoglobin 13.0 - 18.0 g/dL 16.1  Hematocrit 09.6 - 52.0 % 44.0  Platelets 150 - 440 K/uL 247   CMP Latest Ref Rng & Units 09/14/2018  Glucose 70 - 99 mg/dL 045(W)  BUN 6 - 20 mg/dL 12  Creatinine 0.98 - 1.19 mg/dL 1.47  Sodium 829 - 562 mmol/L 138  Potassium 3.5 - 5.1 mmol/L 4.0  Chloride 98 - 111 mmol/L 100  CO2 22 - 32 mmol/L 28  Calcium 8.9 - 10.3 mg/dL 9.8  Total Protein 6.5 - 8.1 g/dL 9.0(H)  Total Bilirubin 0.3 - 1.2 mg/dL 1.0  Alkaline Phos 38 - 126 U/L 81  AST 15 - 41 U/L 34  ALT 0 - 44 U/L 58(H)    Imaging studies:   CT Abdomen/Pelvis on 10/02:   IMPRESSION: 1. Small volume pneumoperitoneum due to gastric antral perforation by 4.1 x 1.3 cm radiopaque foreign body (likely glass). Small volume ascites. 2. Borderline cardiomegaly, pulmonary vascular congestion or atelectasis. Recommend chest radiograph. 3. Patulous esophagus, potential achalasia. 4. Acute findings discussed with and reconfirmed by Dr.JAMES MCSHANE on 09/14/2018 at 3:20 pm.  Assessment/Plan: (ICD-10's: K66.8, T18.9) 37 y.o. male with swallowed foreign body and pneumoperitoneum, complicated by pertinent comorbidities including developmental delay.              - Patient with retained foreign body on CT with pneumoperitoneum and peritonitis on examination. Discussed need for emergent laparotomy with the patient and his mother who are in agreement.              - Will admit to general surgery for post-operative management              A/all risks, benefits, and alternatives to above emergent  procedure(s) were discussed with the patient and his family, all of their questions were answered to their expressed satisfaction, patient expresses he wishes to proceed, and informed consent was obtained.  All of the above findings and recommendations were discussed with the patient and his family, and all of patient's and his family's questions were answered to their expressed satisfaction.  Thank you for the opportunity to participate in this patient's care.   -- Lynden Oxford, PA-C [facility] 09/14/2018, 4:24 PM [phone] M-F: 7am - 4pm

## 2018-09-14 NOTE — ED Triage Notes (Signed)
From [facility], pt c/o upper abd pain that began early am, states it is a sharpe pain, feels like he's being punched in the stomach. Lab work done at [facility], pt appear in NAD.

## 2018-09-14 NOTE — Transfer of Care (Signed)
Immediate Anesthesia Transfer of Care Note  Patient: Ronald Hardy  Procedure(s) Performed: Procedure(s): EXPLORATORY LAPAROTOMY (N/A)  Patient Location: PACU  Anesthesia Type:General  Level of Consciousness: sedated  Airway & Oxygen Therapy: Patient Spontanous Breathing and Patient connected to face mask oxygen  Post-op Assessment: Report given to RN and Post -op Vital signs reviewed and stable  Post vital signs: Reviewed and stable  Last Vitals:  Vitals:   09/14/18 1614 09/14/18 1850  BP: (!) 137/93 121/74  Pulse: (!) 112 (!) 103  Resp: (!) 22 16  Temp:  37 C  SpO2: 94% 99%    Complications: No apparent anesthesia complications

## 2018-09-14 NOTE — Op Note (Signed)
SURGICAL OPERATIVE REPORT  DATE OF PROCEDURE: 09/14/2018  ATTENDING Surgeon(s): Ancil Linsey, MD  ASSISTANT(S): Gillermina Phy, PA-C   ANESTHESIA: general   PRE-OPERATIVE DIAGNOSIS: Gastropyloric perforation due to ingested foreign object (icd-10's: K63.1)  POST-OPERATIVE DIAGNOSIS: 2 mm perforation of pylorus due to ingested shark tooth (icd-10's: K63.1)  PROCEDURE(S):  1.) Primary repair of 1 - 2 mm pylorus perforation with omental patch (cpt: 43840) 2.) Retrieval of ingested 3 cm perforating ingested shark tooth via gastrotomy (cpt: 40981)  INTRAOPERATIVE FINDINGS: 1 - 2 mm pyloric perforation attributed to 3 cm sharp ingested shark tooth, able to be safely mobilized to gastric body from which it was removed via gastrotomy with primary repair of both small pyloric perforation and gastrotomy, nasoenteric tube advanced beyond repaired perforation for gastric decompression as well as to potentially permit post-pyloric feeding   INTRAVENOUS FLUIDS: 1200 mL crystalloid   ESTIMATED BLOOD LOSS: Minimal (<25 mL)  URINE OUTPUT: 350 mL   SPECIMENS: Foreign body (ingested shark tooth) surgically removed from patient's stomach  IMPLANTS: None  DRAINS: Nasogastric Tube  COMPLICATIONS: None apparent  CONDITION AT END OF PROCEDURE: Hemodynamically stable and extubated  DISPOSITION OF PATIENT: PACU  INDICATIONS FOR PROCEDURE:  Patient is a 37 y.o. male who presented to [facility] ED for acute onset of severe abdominal pain with peritonitis and CT demonstrating pneumoperitoneum attributed to gastropyloric perforation by foreign body. Patient is cognitively delayed at baseline, but recalls and reports that he 3 - 4 days ago ingested a shark tooth while putting a shark tooth necklace in his mouth. Though he describes an initial sore throat, he did not inform anybody of this as his pain resolved, presumably as the object passed into his stomach from his esophagus, until he developed severe  abdominal pain, likely as the sharp object perforated his distal gastric antrum vs pylorus, as demonstrated on CT. All risks, benefits, and alternatives to above procedures were discussed with the patient and his mom, all of patient's and his mom's questions were answered to their expressed satisfaction, and informed consent was obtained and documented accordingly.  DETAILS OF PROCEDURE: Patient was brought to the operating suite and appropriately identified. General anesthesia was administered along with appropriate pre-operative antibiotics, and endotracheal intubation was performed by anesthetist. In supine position, operative site was prepped and draped in the usual sterile fashion, and following a brief time out, upper midline laparotomy incision was made using #10 blade scalpel and extended deep through subcutaneous tissues to expose fascia, which was sharply divided, and the peritoneal cavity was entered, taking care to avoid injury to underlying visceral structures. Very little serosanguinous non-enteric fluid was encountered upon entry into patient's abdominal cavity. Self-retaining wound protector was placed. Stomach and pylorus were then exposed and examined with a small thin clot adherent to pyloric serosa adjacent to a 1 - 2 mm anterior perforation. Hard intraluminal foreign body was also easily palpable and was safely able to be mobilized retrograde into the body of patient's stomach. Considering potential for pyloric stenosis vs pyloroplasty with trans-pyloric removal of sharp hard ingested foreign body vs primary repair of small pyloric perforation with trans-gastric removal of ingested sharp hard foreign body, the latter of which was selected.  2 cm transverse gastrotomy was created using electrocautery, through which [employer] retractors placed and utilized to enlarge gastrotomy to facilitate removal of easily palpable foreign body from the stomach, found to be a 3 cm shark tooth. No  additional foreign body was appreciated. 3-0 silk suture was used to  re-approximate pyloric perforation in horizontal mattress fashion. Nasogastric tube was then confirmed to be with tip in the body of the stomach and was advanced under direct visualization and palpation beyond the repaired pyloric perforation. 3-0 Vicryl was used in interrupted fashion to re-approximate gastric mucosa, followed by re-approximation of seromuscular gastric wall using interrupted 3-0 silk sutures in horizontal mattress fashion. Modified Graham omental patch was them approximated over the repaired pyloric perforation and gastrotomy and secured using 3-0 silk sutures without tension.  Self-retaining wound protector was removed from patient's abdomen, and hemostasis was once more confirmed. Exparel mixed with bupivacaine without epinephrine was primarily injected along patient's fascia and then the remainder subcutaneously. Fascia was then re-approximated from below and above using running looped #1 PDS sutures x2 and tied in the middle of the incision. Several buried interrupted deep dermal 3-0 Vicryl sutures were used to re-approximate dermis to reduce tension on skin and to help provide coverage over tied mid-incisional PDS knot, following which skin was re-approximated using surgical skin staples. Surrounding skin was then cleaned and dried, and honeycomb occlusive dressing was applied.  Foley catheter was then remove, and patient was safely able to be extubated, awakened, and transferred to PACU for post-operative monitoring and care. Patient's mother was updated and informed about the possibility of patient ingesting additional non-food items and the importance of close supervision to reduce this risk.  I was present for all aspects of the above procedure, and no operative complications were apparent.

## 2018-09-14 NOTE — Anesthesia Preprocedure Evaluation (Signed)
Anesthesia Evaluation  Patient identified by MRN, date of birth, ID band Patient awake    Reviewed: Allergy & Precautions, H&P , NPO status , Patient's Chart, lab work & pertinent test results  Airway Mallampati: III  TM Distance: >3 FB Neck ROM: full    Dental  (+) Chipped, Poor Dentition   Pulmonary neg pulmonary ROS, neg shortness of breath,           Cardiovascular negative cardio ROS       Neuro/Psych negative psych ROS   GI/Hepatic negative GI ROS, Neg liver ROS,   Endo/Other  negative endocrine ROS  Renal/GU      Musculoskeletal   Abdominal   Peds  (+) mental retardation Hematology negative hematology ROS (+)   Anesthesia Other Findings Free Air on CT  History reviewed. No pertinent past medical history.  History reviewed. No pertinent surgical history.  BMI    Body Mass Index:  30.04 kg/m      Reproductive/Obstetrics negative OB ROS                             Anesthesia Physical Anesthesia Plan  ASA: V and emergent  Anesthesia Plan: General ETT, Rapid Sequence and Cricoid Pressure   Post-op Pain Management:    Induction: Intravenous  PONV Risk Score and Plan:   Airway Management Planned: Oral ETT  Additional Equipment:   Intra-op Plan:   Post-operative Plan: Extubation in OR  Informed Consent: I have reviewed the patients History and Physical, chart, labs and discussed the procedure including the risks, benefits and alternatives for the proposed anesthesia with the patient or authorized representative who has indicated his/her understanding and acceptance.   Dental Advisory Given  Plan Discussed with: Anesthesiologist, CRNA and Surgeon  Anesthesia Plan Comments: (Patient and mother consented for risks of anesthesia including but not limited to:  - adverse reactions to medications - damage to teeth, lips or other oral mucosa - sore throat or  hoarseness - Damage to heart, brain, lungs or loss of life  They voiced understanding.)        Anesthesia Quick Evaluation

## 2018-09-14 NOTE — ED Notes (Signed)
Consent signed by mother of pt for exploratory laparotomy.  Iv fluids infusing.  Pt has sob.  Pt placed on 2 liters oxygen.  sats 90% on room air, sats 94-95% on 2 liters oxygen.  Pt pale.

## 2018-09-15 ENCOUNTER — Other Ambulatory Visit: Payer: Self-pay

## 2018-09-15 ENCOUNTER — Encounter: Payer: Self-pay | Admitting: Surgery

## 2018-09-15 LAB — BASIC METABOLIC PANEL
ANION GAP: 4 — AB (ref 5–15)
BUN: 8 mg/dL (ref 6–20)
CALCIUM: 8.5 mg/dL — AB (ref 8.9–10.3)
CO2: 27 mmol/L (ref 22–32)
CREATININE: 0.69 mg/dL (ref 0.61–1.24)
Chloride: 107 mmol/L (ref 98–111)
GFR calc non Af Amer: >60 mL/min (ref >60)
Glucose, Bld: 152 mg/dL — ABNORMAL HIGH (ref 70–99)
Potassium: 3.9 mmol/L (ref 3.5–5.1)
SODIUM: 138 mmol/L (ref 135–145)

## 2018-09-15 LAB — CBC
HCT: 36.4 % — ABNORMAL LOW (ref 40.0–52.0)
Hemoglobin: 12.4 g/dL — ABNORMAL LOW (ref 13.0–18.0)
MCH: 30.2 pg (ref 26.0–34.0)
MCHC: 34.2 g/dL (ref 32.0–36.0)
MCV: 88.5 fL (ref 80.0–100.0)
Platelets: 208 10*3/uL (ref 150–440)
RBC: 4.12 MIL/uL — ABNORMAL LOW (ref 4.40–5.90)
RDW: 14 % (ref 11.5–14.5)
WBC: 13.3 10*3/uL — AB (ref 3.8–10.6)

## 2018-09-15 NOTE — Progress Notes (Addendum)
SURGICAL PROGRESS NOTE  Patient seen and examined as described below with surgical PA-C, Gillermina Phy.  Assessment/Plan: 37 y.o. male doing well 1 Day Post-Op s/p exploratory laparotomy with trans-gastric removal of foreign body (shark tooth) and repair of pre-pyloric perforation and gastrotomy, complicated by comorbidities including cognitive delay.   - NPO, IV fluids for now   - pain control as needed (minimize narcotics)  - will flush NG tube to ensure ongoing function  - monitor abdominal exam and ongoing bowel function  - continue antibiotics considering perforated abdominal viscus  - may consider starting post-pyloric tube feeds as soon as tomorrow  - follow-up/trend CBC (WBC) tomorrow morning  - DVT prophylaxis, ambulation encouraged  All of the above findings and recommendations were discussed with the patient and patient's mom, and all of patient's and family's questions were answered to their expressed satisfaction.  Thank you for the opportunity to participate in this patient's care.  -- Scherrie Gerlach Earlene Plater, MD, RPVI [place]: [facility] General Surgery - Partnering for exceptional care. Office: [phone]      SURGICAL PROGRESS NOTE  Hospital Day(s): 1.   Post op day(s): 1 Day Post-Op.   Interval History: Patient seen and examined, no acute events overnight. He notes improvement in his abdominal pain this morning. No fevers, chills, nausea, or emesis. NGT has remained in place post-op and he is NPO. He has not been mobilizing. No further complaints this morning  Review of Systems:  Constitutional: denies fever, chills  HEENT: denies cough or congestion  Respiratory: denies any shortness of breath  Cardiovascular: denies chest pain or palpitations  Gastrointestinal: + abdominal pain, denied N/V, or diarrhea/and bowel function as per interval history Genitourinary: denies burning with urination or urinary frequency Musculoskeletal: denies pain,  decreased motor or sensation Integumentary: + Midline incsion Neurological: denies HA or vision/hearing changes   Vital signs in last 24 hours: [min-max] current  Temp:  [98.3 F (36.8 C)-100 F (37.8 C)] 98.4 F (36.9 C) (10/03 0011) Pulse Rate:  [81-112] 81 (10/03 0011) Resp:  [16-22] 20 (10/03 0011) BP: (115-137)/(74-93) 117/78 (10/03 0011) SpO2:  [90 %-99 %] 98 % (10/03 0011) Weight:  [79.4 kg] 79.4 kg (10/02 1257)     Height: 5\' 4"  (162.6 cm) Weight: 79.4 kg BMI (Calculated): 30.02   Intake/Output this shift:  Total I/O In: 198.9 [I.V.:198.9] Out: 500 [Urine:500]   Intake/Output last 2 shifts:  @IOLAST2SHIFTS @   Physical Exam:  Constitutional: alert, cooperative and no distress  HENT: normocephalic without obvious abnormality, NGT in place Eyes: PERRL, EOM's grossly intact and symmetric  Neuro: CN II - XII grossly intact and symmetric without deficit  Respiratory: breathing non-labored at rest  Gastrointestinal: soft, non-tender, non-distended, upper midline incision is CDI, honeycomb dressing present Musculoskeletal: UE and LE FROM, no edema or wounds, motor and sensation grossly intact, NT     Labs:  CBC Latest Ref Rng & Units 09/15/2018 09/14/2018  WBC 3.8 - 10.6 K/uL 13.3(H) 18.3(H)  Hemoglobin 13.0 - 18.0 g/dL 12.4(L) 15.2  Hematocrit 40.0 - 52.0 % 36.4(L) 44.0  Platelets 150 - 440 K/uL 208 247   CMP Latest Ref Rng & Units 09/15/2018 09/14/2018  Glucose 70 - 99 mg/dL 098(J) 191(Y)  BUN 6 - 20 mg/dL 8 12  Creatinine 7.82 - 1.24 mg/dL 9.56 2.13  Sodium 086 - 145 mmol/L 138 138  Potassium 3.5 - 5.1 mmol/L 3.9 4.0  Chloride 98 - 111 mmol/L 107 100  CO2 22 - 32 mmol/L 27  28  Calcium 8.9 - 10.3 mg/dL 8.1(X) 9.8  Total Protein 6.5 - 8.1 g/dL - 9.0(H)  Total Bilirubin 0.3 - 1.2 mg/dL - 1.0  Alkaline Phos 38 - 126 U/L - 81  AST 15 - 41 U/L - 34  ALT 0 - 44 U/L - 58(H)    Imaging studies: No new pertinent imaging studies   Assessment/Plan: (ICD-10's:  K66.8,T18.9) In summary, patient is a 37 y.o. male with gastro-pyloric perforation secondary to ingested shark tooth 3 days ago POD1 following exploratory laparotomy with pylorus repair and omental patch, complicated by pertinent comorbidities including developmental delay.   - POD1, he is showing clinical improvement this morning, abdominal pain improved, VSS   - NGT to remain in place for a few days, 150 ccs out since placement per chart review, okay to clamp this so patient can mobilize   - NPO, will consider starting tube feedings   - Leukocytosis to 13.3, this is improved from yesterday, will continue to follow.   - Continue IVF, IV ABx, pain control PRN  - Encouraged mobilization  - DVT Prophylaxis   All of the above findings and recommendations were discussed with the patient, patient's family, and the medical team, and all of patient's and family's questions were answered to their expressed satisfaction.  Thank you for the opportunity to participate in this patient's care.  -- Lynden Oxford, PA-C [facility] 09/15/2018, 9:00 AM [phone] M-F: 7am - 4pm

## 2018-09-15 NOTE — Plan of Care (Signed)
  Problem: Clinical Measurements: Goal: Ability to maintain clinical measurements within normal limits will improve Outcome: Progressing Goal: Postoperative complications will be avoided or minimized Outcome: Progressing   Problem: Skin Integrity: Goal: Demonstration of wound healing without infection will improve Outcome: Progressing   Problem: Pain Managment: Goal: General experience of comfort will improve Outcome: Progressing   Problem: Safety: Goal: Ability to remain free from injury will improve Outcome: Progressing

## 2018-09-15 NOTE — Anesthesia Postprocedure Evaluation (Addendum)
Anesthesia Post Note  Patient: Ronald Hardy  Procedure(s) Performed: EXPLORATORY LAPAROTOMY (N/A Abdomen)  Patient location during evaluation: PACU Anesthesia Type: General Level of consciousness: awake and alert Pain management: pain level controlled Vital Signs Assessment: post-procedure vital signs reviewed and stable Respiratory status: spontaneous breathing, nonlabored ventilation, respiratory function stable and patient connected to nasal cannula oxygen Cardiovascular status: blood pressure returned to baseline and stable Postop Assessment: no apparent nausea or vomiting Anesthetic complications: no     Last Vitals:  Vitals:   09/14/18 2011 09/15/18 0011  BP: 120/85 117/78  Pulse: 92 81  Resp: 20 20  Temp: 37.3 C 36.9 C  SpO2: 98% 98%    Last Pain:  Vitals:   09/15/18 0011  TempSrc: Oral  PainSc:                  Cleda Mccreedy Demetri Goshert

## 2018-09-16 LAB — BASIC METABOLIC PANEL
ANION GAP: 6 (ref 5–15)
BUN: 8 mg/dL (ref 6–20)
CHLORIDE: 108 mmol/L (ref 98–111)
CO2: 30 mmol/L (ref 22–32)
Calcium: 8 mg/dL — ABNORMAL LOW (ref 8.9–10.3)
Creatinine, Ser: 0.78 mg/dL (ref 0.61–1.24)
GFR calc Af Amer: >60 mL/min (ref >60)
GLUCOSE: 117 mg/dL — AB (ref 70–99)
POTASSIUM: 3.6 mmol/L (ref 3.5–5.1)
Sodium: 144 mmol/L (ref 135–145)

## 2018-09-16 LAB — CBC
HCT: 33.2 % — ABNORMAL LOW (ref 40.0–52.0)
Hemoglobin: 11.5 g/dL — ABNORMAL LOW (ref 13.0–18.0)
MCH: 30.1 pg (ref 26.0–34.0)
MCHC: 34.6 g/dL (ref 32.0–36.0)
MCV: 86.9 fL (ref 80.0–100.0)
Platelets: 184 10*3/uL (ref 150–440)
RBC: 3.82 MIL/uL — AB (ref 4.40–5.90)
RDW: 13.8 % (ref 11.5–14.5)
WBC: 9.4 10*3/uL (ref 3.8–10.6)

## 2018-09-16 LAB — SURGICAL PATHOLOGY

## 2018-09-16 MED ORDER — KCL IN DEXTROSE-NACL 20-5-0.45 MEQ/L-%-% IV SOLN
INTRAVENOUS | Status: DC
  Administered 2018-09-16: 23:00:00 via INTRAVENOUS
  Filled 2018-09-16 (×3): qty 1000

## 2018-09-16 MED ORDER — OXYCODONE HCL 5 MG PO TABS: 5.0000 mg | ORAL_TABLET | ORAL | Status: DC | PRN

## 2018-09-16 NOTE — Progress Notes (Addendum)
SURGICAL PROGRESS NOTE  Patient seen and examined as described below with surgical PA-C, Gillermina Phy.  Assessment/Plan: 37 y.o. male doing very well 2 Days Post-Op s/p exploratory laparotomy with trans-gastric removal of foreign body (shark tooth) and repair of pre-pyloric perforation, complicated by comorbidities including cognitive delay.              - will remove NG tube  - start clear liquids, but advance slowly             - pain control as needed (minimize narcotics)             - monitor abdominal exam and ongoing bowel function             - continue antibiotics considering perforated abdominal viscus             - DVT prophylaxis, ambulation encouraged  All of the above findings and recommendations were discussed with the patient and patient's mom, and all of patient's and family's questions were answered to their expressed satisfaction.  Thank you for the opportunity to participate in this patient's care.  -- Scherrie Gerlach Earlene Plater, MD, RPVI [place]: [facility] General Surgery - Partnering for exceptional care. Office: [phone]      SURGICAL PROGRESS NOTE  Hospital Day(s): 2.   Post op day(s): 2 Days Post-Op.   Interval History: Patient seen and examined, no acute events overnight. Minimal abdominal pain near incision, no nausea or emesis.   Review of Systems:  Constitutional: denies fever, chills  HEENT: denies cough or congestion  Respiratory: denies any shortness of breath  Cardiovascular: denies chest pain or palpitations  Gastrointestinal:+  abdominal pain, no N/V, or diarrhea/and bowel function as per interval history Genitourinary: denies burning with urination or urinary frequency Musculoskeletal: denies pain, decreased motor or sensation Integumentary: denies any other rashes or skin discolorations Neurological: denies HA or vision/hearing changes   Vital signs in last 24 hours: [min-max] current  Temp:  [98.2 F (36.8 C)-98.6  F (37 C)] 98.6 F (37 C) (10/04 0424) Pulse Rate:  [79-84] 79 (10/04 0424) Resp:  [15-20] 20 (10/04 0424) BP: (119-122)/(76-84) 121/80 (10/04 0424) SpO2:  [99 %-100 %] 99 % (10/04 0424)     Height: 5\' 4"  (162.6 cm) Weight: 79.4 kg BMI (Calculated): 30.02   Intake/Output this shift:  Total I/O In: 2242.3 [I.V.:2041.7; IV Piggyback:200.6] Out: 1600 [Urine:1150; Emesis/NG output:450]   Intake/Output last 2 shifts:  @IOLAST2SHIFTS @   Physical Exam:  Constitutional: alert, cooperative and no distress  HENT: normocephalic without obvious abnormality, NGT in place Eyes: PERRL, EOM's grossly intact and symmetric  Neuro: CN II - XII grossly intact and symmetric without deficit  Respiratory: breathing non-labored at rest  Gastrointestinal: soft, non-tender, non-distended, upper midline incision is CDI, honeycomb dressing present Musculoskeletal: UE and LE FROM, no edema or wounds, motor and sensation grossly intact, NT     Labs:  CBC Latest Ref Rng & Units 09/16/2018 09/15/2018 09/14/2018  WBC 3.8 - 10.6 K/uL 9.4 13.3(H) 18.3(H)  Hemoglobin 13.0 - 18.0 g/dL 11.5(L) 12.4(L) 15.2  Hematocrit 40.0 - 52.0 % 33.2(L) 36.4(L) 44.0  Platelets 150 - 440 K/uL 184 208 247   CMP Latest Ref Rng & Units 09/16/2018 09/15/2018 09/14/2018  Glucose 70 - 99 mg/dL 098(J) 191(Y) 782(N)  BUN 6 - 20 mg/dL 8 8 12   Creatinine 0.61 - 1.24 mg/dL 5.62 1.30 8.65  Sodium 135 - 145 mmol/L 144 138 138  Potassium 3.5 - 5.1 mmol/L 3.6  3.9 4.0  Chloride 98 - 111 mmol/L 108 107 100  CO2 22 - 32 mmol/L 30 27 28   Calcium 8.9 - 10.3 mg/dL 8.0(L) 8.5(L) 9.8  Total Protein 6.5 - 8.1 g/dL - - 9.0(H)  Total Bilirubin 0.3 - 1.2 mg/dL - - 1.0  Alkaline Phos 38 - 126 U/L - - 81  AST 15 - 41 U/L - - 34  ALT 0 - 44 U/L - - 58(H)     Assessment/Plan: 38 y.o. male doing well 2 Day Post-Op s/p exploratory laparotomy with trans-gastric removal of foreign body (shark tooth) and repair of pre-pyloric perforation and gastrotomy,  complicated by comorbidities including cognitive delay.              - NGT output low, will remove this afternoon  - Will trial on clears for dinner and advance as tolerated.              - pain control as needed (minimize narcotics)             - monitor abdominal exam and ongoing bowel function             - continue antibiotics considering perforated abdominal viscus             - Leukocytosis resolved             - DVT prophylaxis, ambulation encouraged  - Hopefully home Sunday/Monday  All of the above findings and recommendations were discussed with the patient and patient's mom, and all of patient's and family's questions were answered to their expressed satisfaction.  Thank you for the opportunity to participate in this patient's care.  -- Lynden Oxford, PA-C [facility] 09/16/2018, 11:45 AM [phone] M-F: 7am - 4pm

## 2018-09-16 NOTE — Care Management Important Message (Signed)
Copy of signed IM left with patient in room.  

## 2018-09-17 NOTE — Plan of Care (Addendum)
Pain managed with schedule meds. Tolerating diet changed from clears to soft. Pt had BM today.  Problem: Education: Goal: Required Educational Video(s) Outcome: Progressing   Problem: Clinical Measurements: Goal: Ability to maintain clinical measurements within normal limits will improve Outcome: Progressing Goal: Postoperative complications will be avoided or minimized Outcome: Progressing   Problem: Skin Integrity: Goal: Demonstration of wound healing without infection will improve Outcome: Progressing   Problem: Education: Goal: Knowledge of General Education information will improve Description Including pain rating scale, medication(s)/side effects and non-pharmacologic comfort measures Outcome: Progressing   Problem: Health Behavior/Discharge Planning: Goal: Ability to manage health-related needs will improve Outcome: Progressing   Problem: Clinical Measurements: Goal: Ability to maintain clinical measurements within normal limits will improve Outcome: Progressing Goal: Will remain free from infection Outcome: Progressing Goal: Diagnostic test results will improve Outcome: Progressing Goal: Respiratory complications will improve Outcome: Progressing Goal: Cardiovascular complication will be avoided Outcome: Progressing   Problem: Activity: Goal: Risk for activity intolerance will decrease Outcome: Progressing   Problem: Nutrition: Goal: Adequate nutrition will be maintained Outcome: Progressing   Problem: Coping: Goal: Level of anxiety will decrease Outcome: Progressing   Problem: Elimination: Goal: Will not experience complications related to bowel motility Outcome: Progressing Goal: Will not experience complications related to urinary retention Outcome: Progressing   Problem: Pain Managment: Goal: General experience of comfort will improve Outcome: Progressing   Problem: Safety: Goal: Ability to remain free from injury will improve Outcome:  Progressing   Problem: Skin Integrity: Goal: Risk for impaired skin integrity will decrease Outcome: Progressing

## 2018-09-17 NOTE — Progress Notes (Signed)
POD # 3 Doing very well No pain AVSS Taking clears  PE NAD Abd: soft, minimal tenderness, no peritonitis  A/p DOing  Very well Advance diet DC in am

## 2018-09-18 MED ORDER — HYDROCODONE-ACETAMINOPHEN 5-325 MG PO TABS: 1.0000 | ORAL_TABLET | Freq: Four times a day (QID) | ORAL | 0 refills | Status: DC | PRN

## 2018-09-18 NOTE — Discharge Instructions (Signed)
In addition to included general post-operative instructions for Laparotomy with Removal of Shark's tooth and Repair of Stomach,  Diet: Gradually resume home heart healthy diet.   Activity: No heavy lifting >15 - 20 pounds (children, pets, laundry, garbage) or strenuous activity until follow-up, but light activity and walking are encouraged. Do not drive or drink alcohol if taking narcotic pain medications.  Wound care: You may shower/get incision wet with soapy water and pat dry (do not rub incisions), but no baths or submerging incision underwater until follow-up.   Medications: Resume all home medications. For mild to moderate pain: acetaminophen (Tylenol) or ibuprofen/naproxen (if no kidney disease). Combining Tylenol with alcohol can substantially increase your risk of causing liver disease. Narcotic pain medications, if prescribed, can be used for severe pain, though may cause nausea, constipation, and drowsiness. Do not combine Tylenol and Percocet (or similar) within a 6 hour period as Percocet (and similar) contain(s) Tylenol. If you do not need the narcotic pain medication, you do not need to fill the prescription.  Call office [phone]) at any time if any questions, worsening pain, fevers/chills, bleeding, drainage from incision site, or other concerns.

## 2018-09-19 NOTE — Discharge Summary (Signed)
  Patient ID: JOZIAH DOLLINS MRN: [id number] DOB/AGE: 37-01-1981 37 y.o.  Admit date: 09/14/2018 Discharge date: 09/19/2018   Discharge Diagnoses:  Active Problems:   Perforated stomach, acute   Procedures: Laparotomy with removal foreign body stomach. Dr. Jack Quarto Course: This is a 37 year old male found to have abdominal pain and ingested foreign body with perforation of the stomach.  He was taken emergently by Dr. Earlene Plater for an exploratory laparotomy gastrotomy and removal of foreign body.  His course was uneventful.  He did keep his NG tube for a couple of days and we were able to remove it and advance his diet from a clear liquid diet to a regular diet.  He did very well postoperatively.  At the time of discharge he was ambulating, tolerating regular diet.  His vital signs were stable and he was afebrile.  His physical exam showed a young male in no acute distress.  Awake and alert.  Abdomen: Soft minimal appropriate incisional tenderness was a staples in place with no evidence of infection or peritonitis.  Extremities: No edema well-perfused.  Condition of the patient the time of discharge was stable  Disposition:   Discharge Instructions    Call MD for:  difficulty breathing, headache or visual disturbances   Complete by:  As directed    Call MD for:  extreme fatigue   Complete by:  As directed    Call MD for:  hives   Complete by:  As directed    Call MD for:  persistant dizziness or light-headedness   Complete by:  As directed    Call MD for:  persistant nausea and vomiting   Complete by:  As directed    Call MD for:  redness, tenderness, or signs of infection (pain, swelling, redness, odor or green/yellow discharge around incision site)   Complete by:  As directed    Call MD for:  severe uncontrolled pain   Complete by:  As directed    Call MD for:  temperature >100.4   Complete by:  As directed    Diet - low sodium heart healthy   Complete by:  As directed    Discharge  instructions   Complete by:  As directed    May shower daily   Increase activity slowly   Complete by:  As directed    Lifting restrictions   Complete by:  As directed    20 lbs x 6 wks     Allergies as of 09/18/2018   No Known Allergies     Medication List    TAKE these medications   citalopram 10 MG tablet Commonly known as:  CELEXA Take 10 mg by mouth daily.   HYDROcodone-acetaminophen 5-325 MG tablet Commonly known as:  NORCO/VICODIN Take 1 tablet by mouth every 6 (six) hours as needed for moderate pain.   omeprazole 20 MG capsule Commonly known as:  PRILOSEC Take 20 mg by mouth daily.      Follow-up Information    Ancil Linsey, MD. Schedule an appointment as soon as possible for a visit in 1 week(s).   Specialty:  General Surgery Contact information: [address] [place] [address] [phone]            Sterling Big, MD FACS

## 2018-09-22 ENCOUNTER — Encounter: Payer: Self-pay | Admitting: Surgery

## 2018-09-22 ENCOUNTER — Ambulatory Visit (INDEPENDENT_AMBULATORY_CARE_PROVIDER_SITE_OTHER): Payer: Medicare Other | Admitting: Surgery

## 2018-09-22 VITALS — BP 125/82 | HR 96 | Temp 97.7°F | Ht 67.0 in | Wt 168.2 lb

## 2018-09-22 DIAGNOSIS — Z4889 Encounter for other specified surgical aftercare: Secondary | ICD-10-CM

## 2018-09-22 DIAGNOSIS — F819 Developmental disorder of scholastic skills, unspecified: Secondary | ICD-10-CM

## 2018-09-22 DIAGNOSIS — K3189 Other diseases of stomach and duodenum: Secondary | ICD-10-CM

## 2018-09-22 HISTORY — DX: Developmental disorder of scholastic skills, unspecified: F81.9

## 2018-09-22 NOTE — Progress Notes (Signed)
Surgical Clinic Progress/Follow-up Note   HPI:  37 y.o. Male presents to clinic for post-op follow-up 8 Days s/p laparotomy with repair of pyloric perforation and gastrotomy with omental patch Earlene Plater, 09/14/2018) due to ingested sharp 3 cm shark tooth. Patient reports complete resolution of pre-operative pain and has been tolerating regular diet with +flatus and normal BM's, denies N/V, fever/chills, CP, or SOB.  Review of Systems:  Constitutional: denies fever/chills  Respiratory: denies shortness of breath, wheezing  Cardiovascular: denies chest pain, palpitations  Gastrointestinal: abdominal pain, N/V, and bowel function as per interval history Skin: Denies any other rashes or skin discolorations except post-surgical wounds as per interval history  Vital Signs:  BP 125/82   Pulse 96   Temp 97.7 F (36.5 C) (Temporal)   Ht 5\' 7"  (1.702 m)   Wt 168 lb 3.2 oz (76.3 kg)   BMI 26.34 kg/m    Physical Exam:  Constitutional:  -- Normal body habitus  -- Awake, alert, and oriented x3  Pulmonary:  -- No crackles -- Equal breath sounds bilaterally -- Breathing non-labored at rest Cardiovascular:  -- S1, S2 present  -- No pericardial rubs  Gastrointestinal:  -- Soft and non-distended, non-tender to palpation, no guarding/rebound tenderness -- Post-surgical incisions all well-approximated without any peri-incisional erythema or drainage -- No abdominal masses appreciated, pulsatile or otherwise  Musculoskeletal / Integumentary:  -- Wounds or skin discoloration: None appreciated except post-surgical incisions as described above (GI) -- Extremities: B/L UE and LE FROM, hands and feet warm, no edema   Imaging: No new pertinent imaging available for review  Assessment:  37 y.o. yo Male with a problem list including...  Patient Active Problem List   Diagnosis Date Noted  . Cognitive developmental delay 09/22/2018  . Perforated stomach, acute 09/14/2018    presents to clinic for  post-op follow-up evaluation, doing well 8 Days s/p laparotomy with repair of pyloric perforation and gastrotomy with omental patch Earlene Plater, 09/14/2018) due to ingested sharp 3 cm shark tooth.  Plan:              - advance diet as tolerated  - every other surgical skin staple was removed with steri-strips placed             - okay to shower, but do not submerge incisions under water (baths, swimming)             - no heavy lifting >15 - 20 lbs or strenuous activities until next week, after which no heavy lifting >40 lbs x 1 additional month             - apply sunblock particularly to incisions with sun exposure to reduce pigmentation of scars  - supervision advised to prevent recurrence of swallowing a sharp non-food item             - return to clinic next week for anticipated removal of remaining staples  - instructed to call office if any questions or concerns  All of the above recommendations were discussed with the patient and patient's mom, and all of patient's and family's questions were answered to =their expressed satisfaction.  -- Scherrie Gerlach Earlene Plater, MD, RPVI [place]: [facility] General Surgery - Partnering for exceptional care. Office: [phone]

## 2018-09-22 NOTE — Patient Instructions (Addendum)
You will need to return next week for the remaining staple removal.   GENERAL POST-OPERATIVE PATIENT INSTRUCTIONS   WOUND CARE INSTRUCTIONS:  Keep a dry clean dressing on the wound if there is drainage. The initial bandage may be removed after 24 hours.  Once the wound has quit draining you may leave it open to air.  If clothing rubs against the wound or causes irritation and the wound is not draining you may cover it with a dry dressing during the daytime.  Try to keep the wound dry and avoid ointments on the wound unless directed to do so.  If the wound becomes bright red and painful or starts to drain infected material that is not clear, please contact your physician immediately.  If the wound is mildly pink and has a thick firm ridge underneath it, this is normal, and is referred to as a healing ridge.  This will resolve over the next 4-6 weeks.  BATHING: You may shower if you have been informed of this by your surgeon. However, Please do not submerge in a tub, hot tub, or pool until incisions are completely sealed or have been told by your surgeon that you may do so.  DIET:  You may eat any foods that you can tolerate.  It is a good idea to eat a high fiber diet and take in plenty of fluids to prevent constipation.  If you do become constipated you may want to take a mild laxative or take ducolax tablets on a daily basis until your bowel habits are regular.  Constipation can be very uncomfortable, along with straining, after recent surgery.  ACTIVITY:  You are encouraged to cough and deep breath or use your incentive spirometer if you were given one, every 15-30 minutes when awake.  This will help prevent respiratory complications and low grade fevers post-operatively if you had a general anesthetic.  You may want to hug a pillow when coughing and sneezing to add additional support to the surgical area, if you had abdominal or chest surgery, which will decrease pain during these times.  You are  encouraged to walk and engage in light activity for the next two weeks.  You should not lift more than 20 pounds, until 10/26/18 as it could put you at increased risk for complications.  Twenty pounds is roughly equivalent to a plastic bag of groceries. At that time- Listen to your body when lifting, if you have pain when lifting, stop and then try again in a few days. Soreness after doing exercises or activities of daily living is normal as you get back in to your normal routine.  MEDICATIONS:  Try to take narcotic medications and anti-inflammatory medications, such as tylenol, ibuprofen, naprosyn, etc., with food.  This will minimize stomach upset from the medication.  Should you develop nausea and vomiting from the pain medication, or develop a rash, please discontinue the medication and contact your physician.  You should not drive, make important decisions, or operate machinery when taking narcotic pain medication.  SUNBLOCK Use sun block to incision area over the next year if this area will be exposed to sun. This helps decrease scarring and will allow you avoid a permanent darkened area over your incision.  QUESTIONS:  Please feel free to call our office if you have any questions, and we will be glad to assist you. [phone]

## 2018-09-28 DIAGNOSIS — T189XXA Foreign body of alimentary tract, part unspecified, initial encounter: Secondary | ICD-10-CM

## 2018-09-28 DIAGNOSIS — K668 Other specified disorders of peritoneum: Secondary | ICD-10-CM

## 2018-09-29 ENCOUNTER — Other Ambulatory Visit: Payer: Self-pay

## 2018-09-29 ENCOUNTER — Ambulatory Visit (INDEPENDENT_AMBULATORY_CARE_PROVIDER_SITE_OTHER): Payer: Medicare Other | Admitting: Surgery

## 2018-09-29 ENCOUNTER — Encounter: Payer: Self-pay | Admitting: Surgery

## 2018-09-29 VITALS — BP 117/76 | HR 87 | Temp 97.5°F | Ht 67.0 in | Wt 167.0 lb

## 2018-09-29 DIAGNOSIS — Z4889 Encounter for other specified surgical aftercare: Secondary | ICD-10-CM

## 2018-09-29 DIAGNOSIS — T189XXD Foreign body of alimentary tract, part unspecified, subsequent encounter: Secondary | ICD-10-CM

## 2018-09-29 NOTE — Progress Notes (Signed)
Surgical Clinic Progress/Follow-up Note   HPI:  37 y.o. Male presents to clinic for post-op follow-up 15 Days s/p laparotomy with repair of pyloric perforation and gastrotomy with omental patch Earlene Plater, 09/14/2018) due to ingested 3 cm shark tooth. Patient, along with his mom/caregiver, continues report complete resolution of his former abdominal pain and has been tolerating regular diet with +flatus and normal BM's, denies N/V, fever/chills, CP, or SOB.  Review of Systems:  Constitutional: denies fever/chills  Respiratory: denies shortness of breath, wheezing  Cardiovascular: denies chest pain, palpitations  Gastrointestinal: abdominal pain, N/V, and bowel function as per interval history Skin: Denies any other rashes or skin discolorations except post-surgical wounds as per interval history  Vital Signs:  BP 117/76   Pulse 87   Temp (!) 97.5 F (36.4 C) (Skin)   Ht 5\' 7"  (1.702 m)   Wt 167 lb (75.8 kg)   BMI 26.16 kg/m    Physical Exam:  Constitutional:  -- Normal body habitus  -- Awake, alert, and oriented x3  Pulmonary:  -- No crackles -- Equal breath sounds bilaterally -- Breathing non-labored at rest Cardiovascular:  -- S1, S2 present  -- No pericardial rubs  Gastrointestinal:  -- Soft and non-distended, non-tender to palpation, no guarding/rebound tenderness -- Post-surgical incisions all well-approximated without any peri-incisional erythema or drainage -- No abdominal masses appreciated, pulsatile or otherwise  Musculoskeletal / Integumentary:  -- Wounds or skin discoloration: None appreciated except post-surgical incisions as described above (GI) -- Extremities: B/L UE and LE FROM, hands and feet warm, no edema   Imaging: No new pertinent imaging available for review  Assessment:  37 y.o. yo Male with a problem list including...  Patient Active Problem List   Diagnosis Date Noted  . Pneumoperitoneum   . Swallowed foreign body   . Cognitive developmental  delay 09/22/2018    presents to clinic for post-op follow-up evaluation, doing very well 15 Days s/p laparotomy with repair of pyloric perforation and gastrotomy with omental patch Earlene Plater, 09/14/2018) due to ingested 3 cm shark tooth.  Plan:              - advance diet as tolerated  - remainder of surgical skin staples removed, steri-strips placed             - okay to submerge incisions under water (baths, swimming) prn             - no heavy lifting >40 lbs x 4 weeks, after which gradually resume all activities without restrictions             - apply sunblock particularly to incisions with sun exposure to reduce pigmentation of scars             - supervision advised to prevent recurrence of swallowing a sharp non-food item             - return to clinic as needed, instructed to call office if any questions or concerns  All of the above recommendations were discussed with the patient and patient's family, and all of patient's and family's questions were answered to their expressed satisfaction.  -- Scherrie Gerlach Earlene Plater, MD, RPVI [place]: [facility] General Surgery - Partnering for exceptional care. Office: [phone]

## 2018-09-29 NOTE — Patient Instructions (Addendum)
Return as needed.The patient is aware to call back for any questions or concerns.  

## 2019-07-25 DIAGNOSIS — Z136 Encounter for screening for cardiovascular disorders: Secondary | ICD-10-CM | POA: Diagnosis not present

## 2019-07-25 DIAGNOSIS — Z1159 Encounter for screening for other viral diseases: Secondary | ICD-10-CM | POA: Diagnosis not present

## 2019-07-25 DIAGNOSIS — F331 Major depressive disorder, recurrent, moderate: Secondary | ICD-10-CM | POA: Diagnosis not present

## 2019-10-02 DIAGNOSIS — F331 Major depressive disorder, recurrent, moderate: Secondary | ICD-10-CM | POA: Diagnosis not present

## 2019-11-22 DIAGNOSIS — Z1159 Encounter for screening for other viral diseases: Secondary | ICD-10-CM | POA: Diagnosis not present

## 2019-11-22 DIAGNOSIS — F331 Major depressive disorder, recurrent, moderate: Secondary | ICD-10-CM | POA: Diagnosis not present

## 2019-11-22 DIAGNOSIS — Z136 Encounter for screening for cardiovascular disorders: Secondary | ICD-10-CM | POA: Diagnosis not present

## 2019-11-29 DIAGNOSIS — Z0000 Encounter for general adult medical examination without abnormal findings: Secondary | ICD-10-CM | POA: Diagnosis not present

## 2020-02-29 ENCOUNTER — Ambulatory Visit: Payer: Medicare HMO | Attending: Internal Medicine

## 2020-02-29 DIAGNOSIS — Z23 Encounter for immunization: Secondary | ICD-10-CM

## 2020-02-29 NOTE — Progress Notes (Signed)
   Covid-19 Vaccination Clinic  Name:  Ronald Hardy    MRN: [id number] DOB: Feb 10, 1981  02/29/2020  [name] was observed post Covid-19 immunization for 15 minutes without incident. He was provided with Vaccine Information Sheet and instruction to access the V-Safe system.   [name] was instructed to call 911 with any severe reactions post vaccine: Marland Kitchen Difficulty breathing  . Swelling of face and throat  . A fast heartbeat  . A bad rash all over body  . Dizziness and weakness   Immunizations Administered    Name Date Dose VIS Date Route   Pfizer COVID-19 Vaccine 02/29/2020 11:24 AM 0.3 mL 11/24/2019 Intramuscular   Manufacturer: [employer], [employer]   Lot: [id number]   NDC: [address]-0

## 2020-03-25 ENCOUNTER — Ambulatory Visit: Payer: Medicare HMO | Attending: Internal Medicine

## 2020-03-25 DIAGNOSIS — Z23 Encounter for immunization: Secondary | ICD-10-CM

## 2020-03-25 NOTE — Progress Notes (Signed)
   Covid-19 Vaccination Clinic  Name:  Ronald Hardy    MRN: [id number] DOB: 09-Sep-1981  03/25/2020  [name] was observed post Covid-19 immunization for 15 minutes without incident. He was provided with Vaccine Information Sheet and instruction to access the V-Safe system.   [name] was instructed to call 911 with any severe reactions post vaccine: Marland Kitchen Difficulty breathing  . Swelling of face and throat  . A fast heartbeat  . A bad rash all over body  . Dizziness and weakness   Immunizations Administered    Name Date Dose VIS Date Route   Pfizer COVID-19 Vaccine 03/25/2020 11:42 AM 0.3 mL 11/24/2019 Intramuscular   Manufacturer: [employer], [employer]   Lot: [id number]   NDC: [address]-2      Covid-19 Vaccination Clinic  Name:  Ronald Hardy    MRN: [id number] DOB: 25-Dec-1980  03/25/2020  [name] was observed post Covid-19 immunization for 15 minutes without incident. He was provided with Vaccine Information Sheet and instruction to access the V-Safe system.   [name] was instructed to call 911 with any severe reactions post vaccine: Marland Kitchen Difficulty breathing  . Swelling of face and throat  . A fast heartbeat  . A bad rash all over body  . Dizziness and weakness   Immunizations Administered    Name Date Dose VIS Date Route   Pfizer COVID-19 Vaccine 03/25/2020 11:42 AM 0.3 mL 11/24/2019 Intramuscular   Manufacturer: [employer], [employer]   Lot: [id number]   NDC: [address]-1

## 2020-04-21 IMAGING — CT CT ABD-PELV W/ CM
2 of 4 series · 16 of 46 positions shown, 18 images · IV contrast (APPLIED)
Comparison: None.

CLINICAL DATA: Severe diffuse abdominal pain, worse in RIGHT upper
quadrant.

EXAM:
CT ABDOMEN AND PELVIS WITH CONTRAST
TECHNIQUE: Multidetector CT imaging of the abdomen and pelvis was performed
using the standard protocol following bolus administration of
intravenous contrast.
CONTRAST:  100mL V1F11P-EMM IOPAMIDOL (V1F11P-EMM) INJECTION 61%

[Series 2: routine abd/pel with · axial · 0.78mm/px · z∈[-481,-21]mm · 13 of 102 slices shown, 15 images]
[im 5/102  soft-tissue]
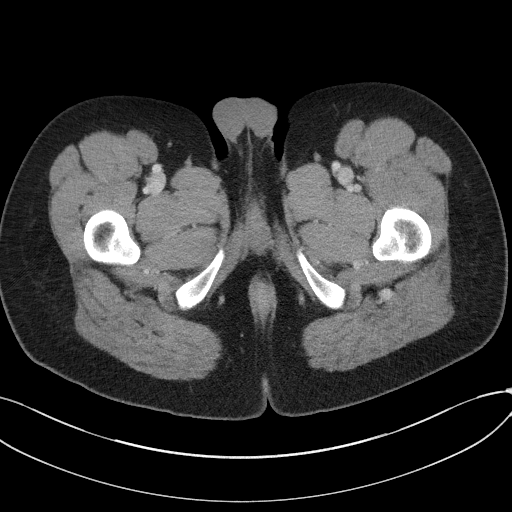
[im 5/102  bone]
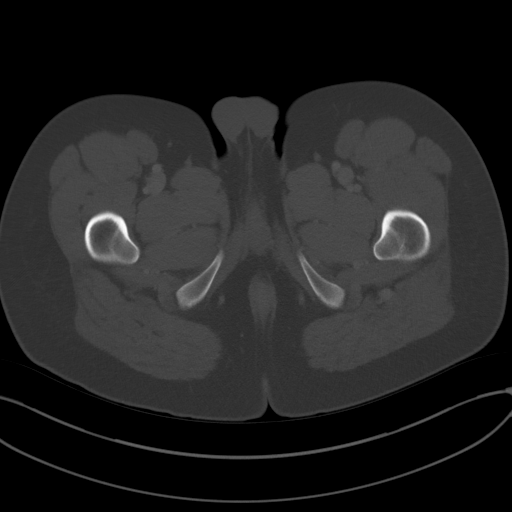
[im 13/102  soft-tissue]
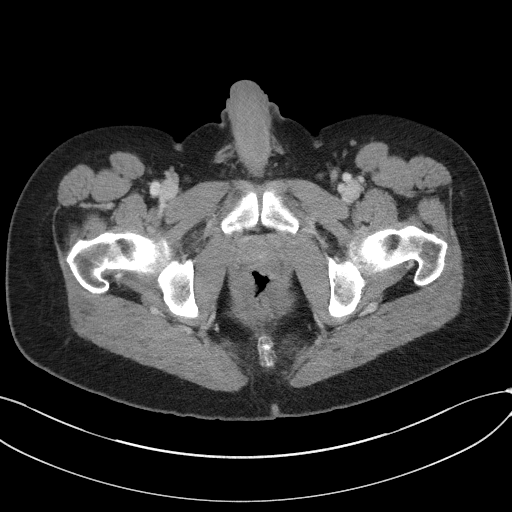
[im 21/102  soft-tissue]
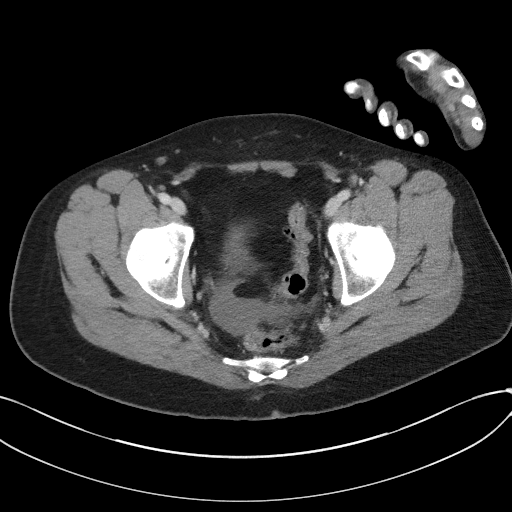
[im 29/102  soft-tissue]
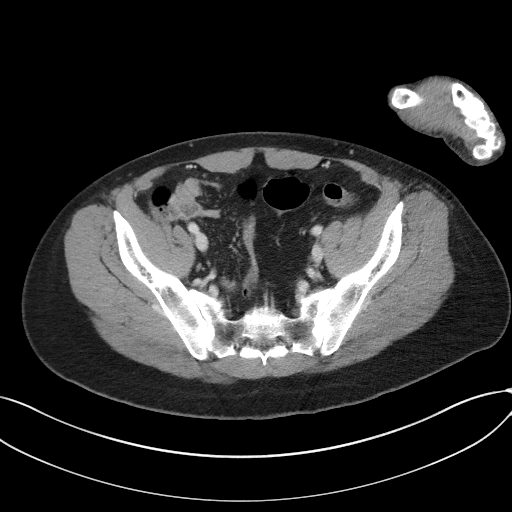
[im 37/102  soft-tissue]
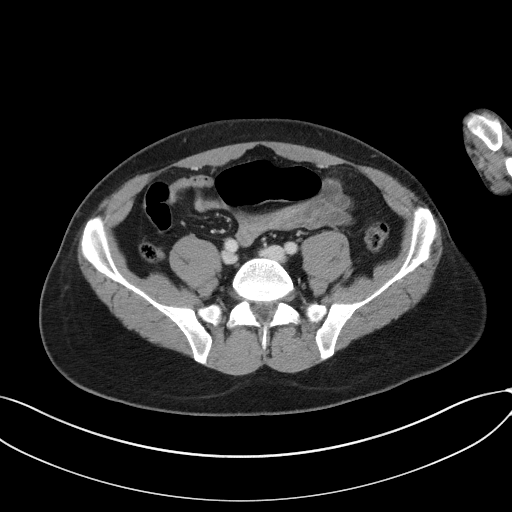
[im 45/102  soft-tissue]
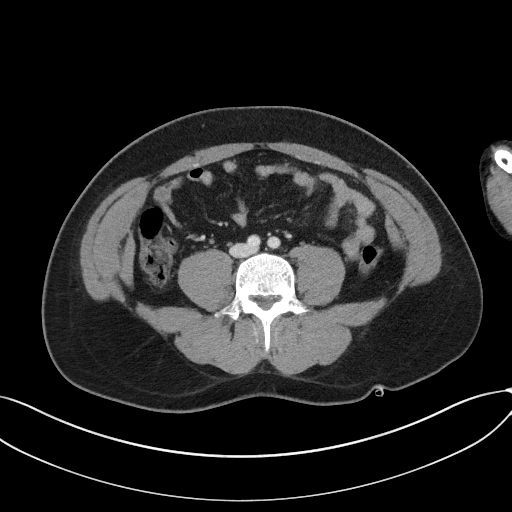
[im 53/102  soft-tissue]
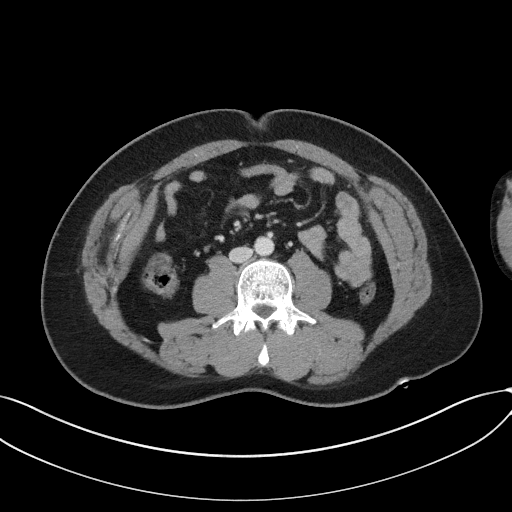
[im 57/102  soft-tissue]
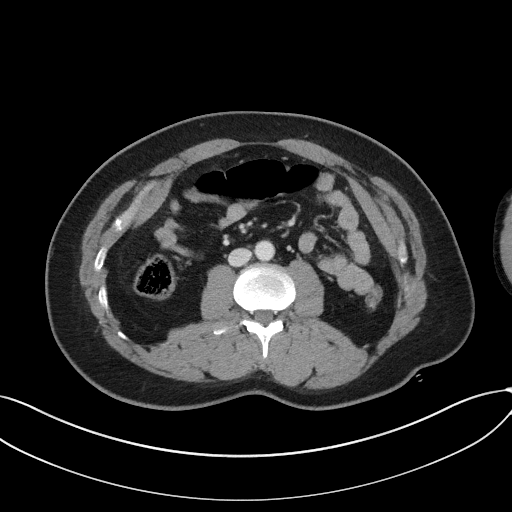
[im 65/102  soft-tissue]
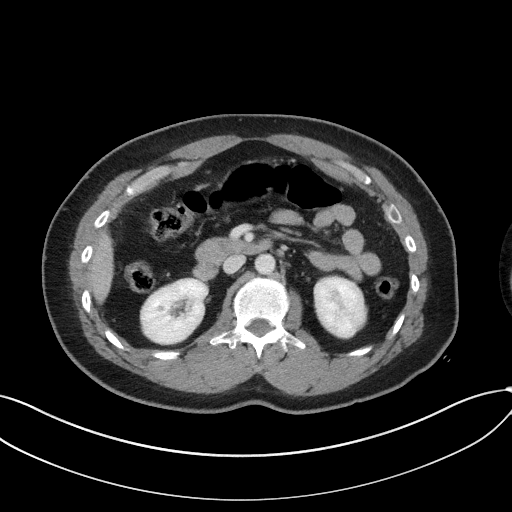
[im 65/102  bone]
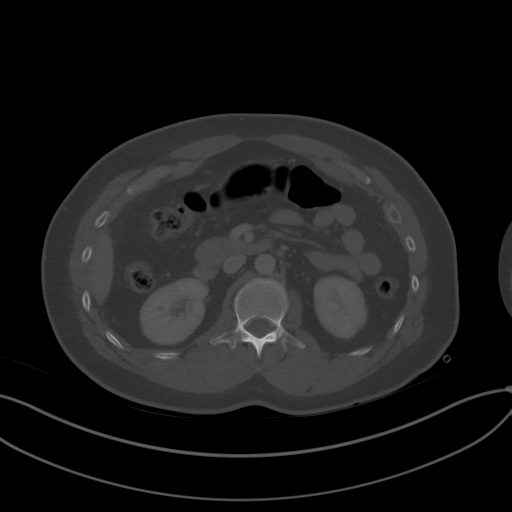
[im 73/102  soft-tissue]
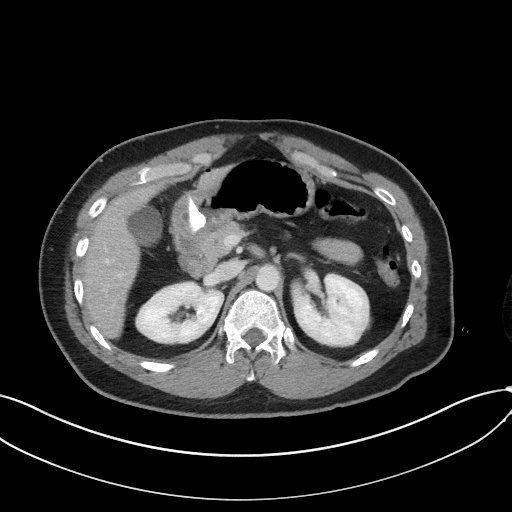
[im 81/102  soft-tissue]
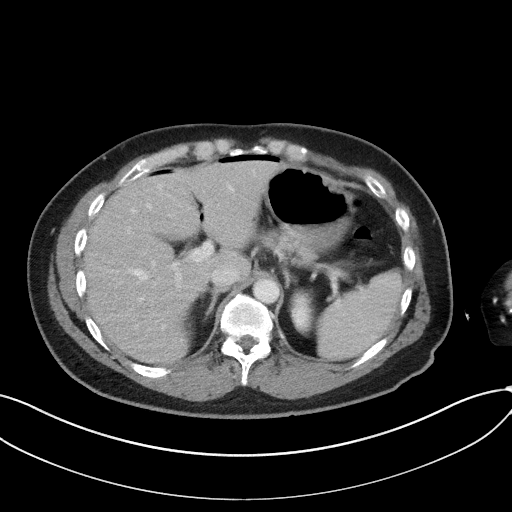
[im 89/102  soft-tissue]
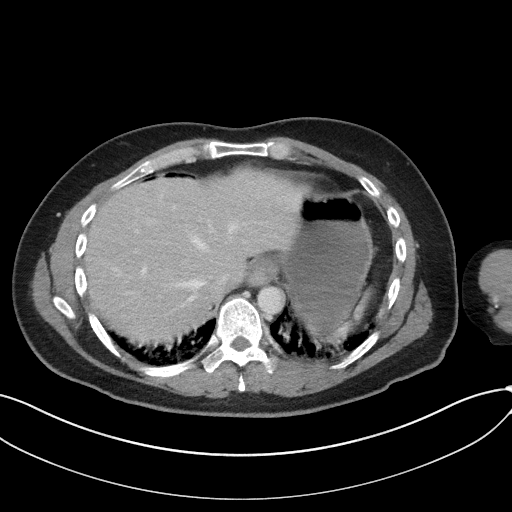
[im 97/102  soft-tissue]
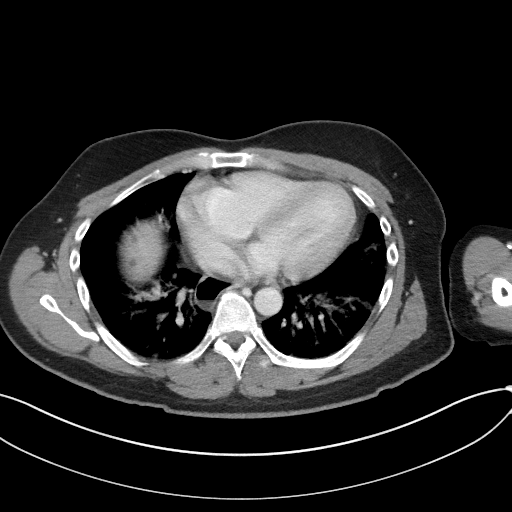

[Series 5: coronal st · coronal · 0.76mm/px · 3 of 79 slices shown]
[im 27/79  soft-tissue]
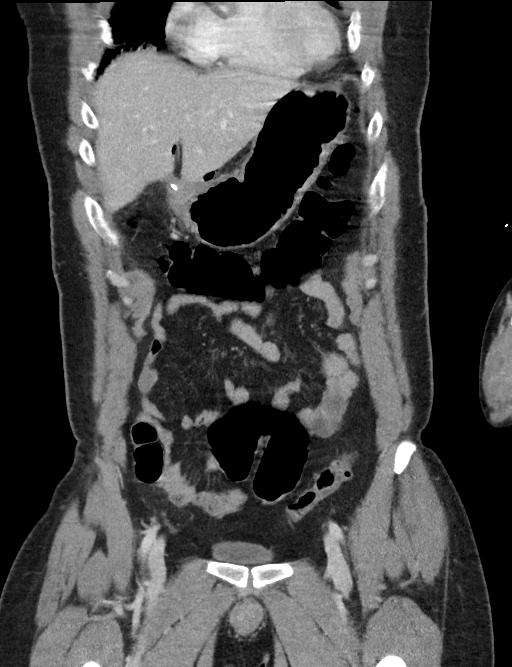
[im 35/79  soft-tissue]
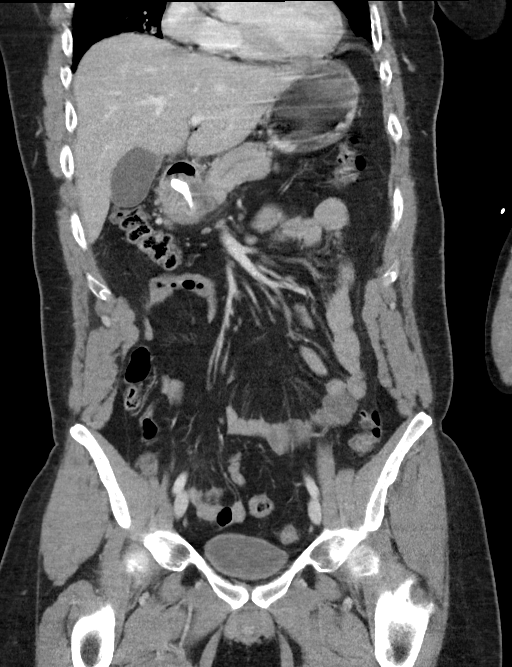
[im 44/79  soft-tissue]
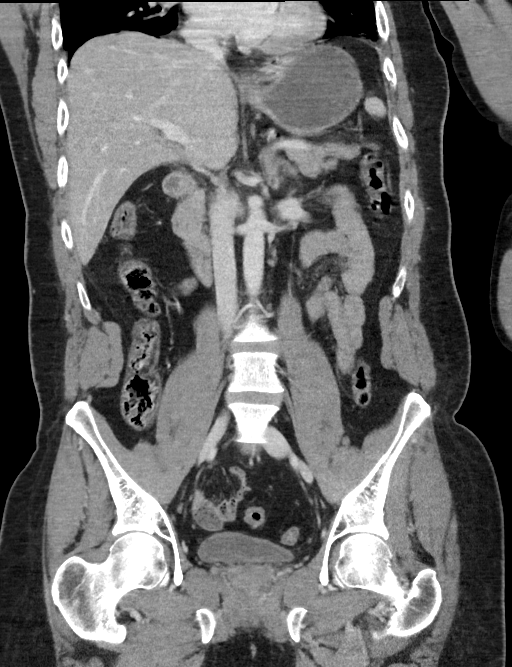

[16 of 46 positions shown; findings below may reference images not displayed]

FINDINGS: LOWER CHEST: The heart is upper limits of normal size. Pulmonary
vascular congestion and dependent atelectasis. Patulous esophagus
with air-fluid level.

HEPATOBILIARY: Liver and gallbladder are normal.

PANCREAS: Normal.

SPLEEN: Normal.

ADRENALS/URINARY TRACT: Kidneys are orthotopic, demonstrating
symmetric enhancement. No nephrolithiasis, hydronephrosis or solid
renal masses. Too small to characterize hypodensities bilateral
kidneys. The unopacified ureters are normal in course and caliber.
Urinary bladder is partially distended and unremarkable. Normal
adrenal glands.

STOMACH/BOWEL: 4.1 x 1.3 cm rectangular radiopaque foreign body
within the gastric antrum extending through the lumen with gastric
antral wall thickening and inflammation. Small large bowel are
normal in course and caliber. Mild colonic diverticulosis. Normal
appendix.

VASCULAR/LYMPHATIC: Aortoiliac vessels are normal in course and
caliber. No lymphadenopathy by CT size criteria.

REPRODUCTIVE: Normal.

OTHER: Small volume pneumoperitoneum predominant in the RIGHT upper
quadrant. Small volume low-density free fluid in the pelvis. No
intraperitoneal fluid collections.

MUSCULOSKELETAL: Nonacute. Respiratory motion most apparent through
sternum, no fracture.
IMPRESSION: 1. Small volume pneumoperitoneum due to gastric antral perforation
by 4.1 x 1.3 cm radiopaque foreign body (likely glass). Small volume
ascites.
2. Borderline cardiomegaly, pulmonary vascular congestion or
atelectasis. Recommend chest radiograph.
3. Patulous esophagus, potential achalasia.
4. Acute findings discussed with and reconfirmed by Dr.EBIS SANMI
on 09/14/2018 at [DATE].

## 2020-04-22 DIAGNOSIS — Z03818 Encounter for observation for suspected exposure to other biological agents ruled out: Secondary | ICD-10-CM | POA: Diagnosis not present

## 2020-05-02 DIAGNOSIS — Z79899 Other long term (current) drug therapy: Secondary | ICD-10-CM | POA: Diagnosis not present

## 2020-05-02 DIAGNOSIS — F331 Major depressive disorder, recurrent, moderate: Secondary | ICD-10-CM | POA: Diagnosis not present

## 2020-05-30 DIAGNOSIS — F331 Major depressive disorder, recurrent, moderate: Secondary | ICD-10-CM | POA: Diagnosis not present

## 2020-11-27 DIAGNOSIS — Z136 Encounter for screening for cardiovascular disorders: Secondary | ICD-10-CM | POA: Diagnosis not present

## 2020-11-27 DIAGNOSIS — F331 Major depressive disorder, recurrent, moderate: Secondary | ICD-10-CM | POA: Diagnosis not present

## 2020-12-03 DIAGNOSIS — F32A Depression, unspecified: Secondary | ICD-10-CM | POA: Diagnosis not present

## 2020-12-03 DIAGNOSIS — Z0000 Encounter for general adult medical examination without abnormal findings: Secondary | ICD-10-CM | POA: Diagnosis not present
